# Patient Record
Sex: Female | Born: 1965 | Race: White | Hispanic: No | Marital: Married | Smoking: Never smoker
Health system: Southern US, Community
[De-identification: ages and names within clinical notes are randomized; demographics above are authoritative.]

## PROBLEM LIST (undated history)

## (undated) DIAGNOSIS — E063 Autoimmune thyroiditis: Secondary | ICD-10-CM

## (undated) DIAGNOSIS — E079 Disorder of thyroid, unspecified: Secondary | ICD-10-CM

## (undated) DIAGNOSIS — M8700 Idiopathic aseptic necrosis of unspecified bone: Secondary | ICD-10-CM

## (undated) HISTORY — DX: Disorder of thyroid, unspecified: E07.9

## (undated) HISTORY — PX: TOTAL HIP ARTHROPLASTY: SHX124

## (undated) HISTORY — DX: Autoimmune thyroiditis: E06.3

## (undated) HISTORY — PX: OTHER SURGICAL HISTORY: SHX169

## (undated) HISTORY — PX: TUBAL LIGATION: SHX77

## (undated) HISTORY — DX: Idiopathic aseptic necrosis of unspecified bone: M87.00

## (undated) HISTORY — PX: INCONTINENCE SURGERY: SHX676

## (undated) HISTORY — PX: SHOULDER SURGERY: SHX246

---

## 2014-08-07 ENCOUNTER — Ambulatory Visit: Payer: BLUE CROSS/BLUE SHIELD | Admitting: Family

## 2014-08-08 ENCOUNTER — Encounter: Payer: Self-pay | Admitting: Family

## 2014-08-08 ENCOUNTER — Other Ambulatory Visit: Payer: BLUE CROSS/BLUE SHIELD

## 2014-08-08 ENCOUNTER — Ambulatory Visit (INDEPENDENT_AMBULATORY_CARE_PROVIDER_SITE_OTHER): Payer: BLUE CROSS/BLUE SHIELD | Admitting: Family

## 2014-08-08 VITALS — BP 110/70 | HR 87 | Temp 98.3°F | Resp 18 | Ht 66.0 in | Wt 185.0 lb

## 2014-08-08 DIAGNOSIS — M87052 Idiopathic aseptic necrosis of left femur: Secondary | ICD-10-CM

## 2014-08-08 DIAGNOSIS — E559 Vitamin D deficiency, unspecified: Secondary | ICD-10-CM

## 2014-08-08 DIAGNOSIS — E039 Hypothyroidism, unspecified: Secondary | ICD-10-CM

## 2014-08-08 DIAGNOSIS — M8700 Idiopathic aseptic necrosis of unspecified bone: Secondary | ICD-10-CM | POA: Insufficient documentation

## 2014-08-08 NOTE — Assessment & Plan Note (Signed)
Previous TSH was 0.67. Hypothyroidism stable with current regimen. Continue Armour Thyroid 75 mg daily.

## 2014-08-08 NOTE — Assessment & Plan Note (Signed)
Patient with previous history of vitamin D deficiency. Obtain vitamin D levels to determine current status. Treatment pending blood work.

## 2014-08-08 NOTE — Assessment & Plan Note (Signed)
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 She recently had her right hip joint replaced. She is scheduled to undergo total left hip replacement in June of this year. This will be completed [facility] in [place]. Follow-up as needed.

## 2014-08-08 NOTE — Progress Notes (Signed)
Subjective:    Patient ID: Kristi Kerr, female    DOB: 02/07/1966, 49 y.o.   MRN: [id number]   Chief Complaint  Patient presents with  . Establish Care    Discuss Vitamin D, Avascular necrosis, and hypothyroid    HPI:  Kristi Kerr is a 49 y.o. female who presents today to establish care and discuss her Vitamin D level.   1) Low Vitamin D - Previously noted to have low vitamin D levels and was previously taking 3 1,000 mg tablets of vitamin D daily and is now taking bone supplements and a multivitamin.   2) Avascular necrosis - Previously found to have bilateral AVN. She has since had her right hip replaced. She is scheduled to [facility].   3) Hypothyroidism - stable and currently maintained on Armour thyroid medication. Her last TSH was noted to be 0.67.   Allergies  Allergen Reactions  . Codeine Nausea Only  . Latex   . Naproxen Nausea And Vomiting    No current outpatient prescriptions on file prior to visit.   No current facility-administered medications on file prior to visit.    Past Medical History  Diagnosis Date  . Thyroid disease   . AVN (avascular necrosis of bone)   . Hashimoto's disease     Past Surgical History  Procedure Laterality Date  . Total hip arthroplasty    . Cesarean section    . Tubal ligation    . Incontinence surgery    . Right knee surgery    . Shoulder surgery Right     Family History  Problem Relation Age of Onset  . Heart disease Father   . Heart attack Father [id number]  . Hypertension Father     History   Social History  . Marital Status: Married    Spouse Name: N/A  . Number of Children: 2  . Years of Education: 16   Occupational History  . 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 Smoking status: Never Smoker   . Smokeless tobacco: Never Used  . Alcohol Use: No  . Drug Use: No  . Sexual Activity: Yes    Birth Control/ Protection: Surgical   Other Topics Concern  .  Not on file   Social History Narrative   Denies any religious beliefs effecting health care.     Review of Systems  Constitutional: Negative for fever and chills.  Endocrine: Negative for cold intolerance and heat intolerance.  Musculoskeletal: Positive for arthralgias (Left hip pain).      Objective:    BP 110/70 mmHg  Pulse 87  Temp(Src) 98.3 F (36.8 C) (Oral)  Resp 18  Ht 5\' 6"  (1.676 m)  Wt 185 lb (83.915 kg)  BMI 29.87 kg/m2  SpO2 98% Nursing note and vital signs reviewed.  Physical Exam  Constitutional: She is oriented to person, place, and time. She appears well-developed and well-nourished. No distress.  Cardiovascular: Normal rate, regular rhythm, normal heart sounds and intact distal pulses.   Pulmonary/Chest: Effort normal and breath sounds normal.  Neurological: She is alert and oriented to person, place, and time.  Skin: Skin is warm and dry.  Incision from surgery noted on right hip; pink and dry without evidence or redness or swelling.  Psychiatric: She has a normal mood and affect. Her behavior is normal. Judgment and thought content normal.       Assessment & Plan:

## 2014-08-08 NOTE — Patient Instructions (Signed)
Thank you for choosing [facility].  Summary/Instructions:  Please stop by the lab on the basement level of the building for your blood work. Your results will be released to MyChart (or called to you) after review, usually within 72 hours after test completion. If any changes need to be made, you will be notified at that same time.  If your symptoms worsen or fail to improve, please contact our office for further instruction, or in case of emergency go directly to the emergency room at the closest medical facility.   Vitamin D Deficiency Vitamin D is an important vitamin that your body needs. Having too little of it in your body is called a deficiency. A very bad deficiency can make your bones soft and can cause a condition called rickets.  Vitamin D is important to your body for different reasons, such as:   It helps your body absorb 2 minerals called calcium and phosphorus.  It helps make your bones healthy.  It may prevent some diseases, such as diabetes and multiple sclerosis.  It helps your muscles and heart. You can get vitamin D in several ways. It is a natural part of some foods. The vitamin is also added to some dairy products and cereals. Some people take vitamin D supplements. Also, your body makes vitamin D when you are in the sun. It changes the sun's rays into a form of the vitamin that your body can use. CAUSES   Not eating enough foods that contain vitamin D.  Not getting enough sunlight.  Having certain digestive system diseases that make it hard to absorb vitamin D. These diseases include Crohn's disease, chronic pancreatitis, and cystic fibrosis.  Having a surgery in which part of the stomach or small intestine is removed.  Being obese. Fat cells pull vitamin D out of your blood. That means that obese people may not have enough vitamin D left in their blood and in other body tissues.  Having chronic kidney or liver disease. RISK FACTORS Risk factors are  things that make you more likely to develop a vitamin D deficiency. They include:  Being older.  Not being able to get outside very much.  Living in a nursing home.  Having had broken bones.  Having weak or thin bones (osteoporosis).  Having a disease or condition that changes how your body absorbs vitamin D.  Having dark skin.  Some medicines such as seizure medicines or steroids.  Being overweight or obese. SYMPTOMS Mild cases of vitamin D deficiency may not have any symptoms. If you have a very bad case, symptoms may include:  Bone pain.  Muscle pain.  Falling often.  Broken bones caused by a minor injury, due to osteoporosis. DIAGNOSIS A blood test is the best way to tell if you have a vitamin D deficiency. TREATMENT Vitamin D deficiency can be treated in different ways. Treatment for vitamin D deficiency depends on what is causing it. Options include:  Taking vitamin D supplements.  Taking a calcium supplement. Your caregiver will suggest what dose is best for you. HOME CARE INSTRUCTIONS  Take any supplements that your caregiver prescribes. Follow the directions carefully. Take only the suggested amount.  Have your blood tested 2 months after you start taking supplements.  Eat foods that contain vitamin D. Healthy choices include:  Fortified dairy products, cereals, or juices. Fortified means vitamin D has been added to the food. Check the label on the package to be sure.  Fatty fish like salmon or  trout.  Eggs.  Oysters.  Do not use a tanning bed.  Keep your weight at a healthy level. Lose weight if you need to.  Keep all follow-up appointments. Your caregiver will need to perform blood tests to make sure your vitamin D deficiency is going away. SEEK MEDICAL CARE IF:  You have any questions about your treatment.  You continue to have symptoms of vitamin D deficiency.  You have nausea or vomiting.  You are constipated.  You feel  confused.  You have severe abdominal or back pain. MAKE SURE YOU:  Understand these instructions.  Will watch your condition.  Will get help right away if you are not doing well or get worse. Document Released: 07/27/2011 Document Revised: 08/29/2012 Document Reviewed: 07/27/2011 [facility] Patient Information 2015 [place]. This information is not intended to replace advice given to you by your health care provider. Make sure you discuss any questions you have with your health care provider.

## 2014-08-08 NOTE — Progress Notes (Signed)
Pre visit review using our clinic review tool, if applicable. No additional management support is needed unless otherwise documented below in the visit note. 

## 2014-08-11 ENCOUNTER — Encounter: Payer: Self-pay | Admitting: Family

## 2014-08-11 LAB — VITAMIN D 1,25 DIHYDROXY
Vitamin D 1, 25 (OH)2 Total: 69 pg/mL (ref 18–72)
Vitamin D2 1, 25 (OH)2: 8 pg/mL
Vitamin D3 1, 25 (OH)2: 61 pg/mL

## 2014-10-10 ENCOUNTER — Ambulatory Visit (INDEPENDENT_AMBULATORY_CARE_PROVIDER_SITE_OTHER): Payer: BLUE CROSS/BLUE SHIELD | Admitting: Family

## 2014-10-10 ENCOUNTER — Encounter: Payer: Self-pay | Admitting: Family

## 2014-10-10 ENCOUNTER — Other Ambulatory Visit (INDEPENDENT_AMBULATORY_CARE_PROVIDER_SITE_OTHER): Payer: BLUE CROSS/BLUE SHIELD

## 2014-10-10 DIAGNOSIS — Z01818 Encounter for other preprocedural examination: Secondary | ICD-10-CM | POA: Diagnosis not present

## 2014-10-10 DIAGNOSIS — Z0000 Encounter for general adult medical examination without abnormal findings: Secondary | ICD-10-CM | POA: Diagnosis not present

## 2014-10-10 LAB — CBC
HCT: 39.9 % (ref 36.0–46.0)
HEMOGLOBIN: 13.7 g/dL (ref 12.0–15.0)
MCHC: 34.3 g/dL (ref 30.0–36.0)
MCV: 85.8 fl (ref 78.0–100.0)
Platelets: 279 10*3/uL (ref 150.0–400.0)
RBC: 4.66 Mil/uL (ref 3.87–5.11)
RDW: 13.5 % (ref 11.5–15.5)
WBC: 5.7 10*3/uL (ref 4.0–10.5)

## 2014-10-10 LAB — POCT URINALYSIS DIPSTICK
BILIRUBIN UA: NEGATIVE
Blood, UA: NEGATIVE
GLUCOSE UA: NEGATIVE
Ketones, UA: NEGATIVE
Leukocytes, UA: NEGATIVE
NITRITE UA: NEGATIVE
SPEC GRAV UA: 1.01
Urobilinogen, UA: NEGATIVE
pH, UA: 8

## 2014-10-10 LAB — LIPID PANEL
Cholesterol: 185 mg/dL (ref 0–200)
HDL: 68.5 mg/dL (ref >39.00)
LDL Cholesterol: 101 mg/dL — ABNORMAL HIGH (ref 0–99)
NonHDL: 116.5
TRIGLYCERIDES: 78 mg/dL (ref 0.0–149.0)
Total CHOL/HDL Ratio: 3
VLDL: 15.6 mg/dL (ref 0.0–40.0)

## 2014-10-10 LAB — COMPREHENSIVE METABOLIC PANEL
ALT: 11 U/L (ref 0–35)
AST: 17 U/L (ref 0–37)
Albumin: 4.5 g/dL (ref 3.5–5.2)
Alkaline Phosphatase: 96 U/L (ref 39–117)
BUN: 9 mg/dL (ref 6–23)
CO2: 31 meq/L (ref 19–32)
Calcium: 9.4 mg/dL (ref 8.4–10.5)
Chloride: 103 mEq/L (ref 96–112)
Creatinine, Ser: 0.75 mg/dL (ref 0.40–1.20)
GFR: 88.17 mL/min (ref >60.00)
Glucose, Bld: 103 mg/dL — ABNORMAL HIGH (ref 70–99)
Potassium: 4.1 mEq/L (ref 3.5–5.1)
SODIUM: 138 meq/L (ref 135–145)
TOTAL PROTEIN: 7.5 g/dL (ref 6.0–8.3)
Total Bilirubin: 0.4 mg/dL (ref 0.2–1.2)

## 2014-10-10 LAB — APTT: aPTT: 34.3 s — ABNORMAL HIGH (ref 23.4–32.7)

## 2014-10-10 LAB — TSH: TSH: 0.83 u[IU]/mL (ref 0.35–4.50)

## 2014-10-10 LAB — PROTIME-INR
INR: 1 ratio (ref 0.8–1.0)
Prothrombin Time: 11 s (ref 9.6–13.1)

## 2014-10-10 NOTE — Assessment & Plan Note (Addendum)
1) Anticipatory Guidance: Discussed importance of wearing a seatbelt while driving and not texting while driving; changing batteries in smoke detector at least once annually; wearing suntan lotion when outside; eating a balanced and moderate diet; getting physical activity at least 30 minutes per day.  2) Immunizations / Screenings / Labs:  All immunizations are up-to-date per recommendations. All screenings are up-to-date per recommendations. Obtain CBC, complete metabolic panel, Lipid profile and TSH. Obtain EKG.   Overall well exam. Patient has minimal cardiovascular risk factors at this time. Her BMI is 30 indicating obesity. Discussed importance of nutrient dense diet and increasing physical activity as tolerated by her hip. The goal would be to lose approximately 5-10% of her current body weight when able. She is scheduled to undergo hip replacement in 2 weeks. Follow-up prevention exam in one year. Follow-up office visit pending surgery and blood work.

## 2014-10-10 NOTE — Patient Instructions (Addendum)
Thank you for choosing [facility].  Summary/Instructions:  Please continue to take your medications as prescribed.   [facility] Address: [address] [place], [place] [address] [phone]  Please stop by the lab on the basement level of the building for your blood work. Your results will be released to [place] (or called to you) after review, usually within 72 hours after test completion. If any changes need to be made, you will be notified at that same time.   Health Maintenance Adopting a healthy lifestyle and getting preventive care can go a long way to promote health and wellness. Talk with your health care provider about what schedule of regular examinations is right for you. This is a good chance for you to check in with your provider about disease prevention and staying healthy. In between checkups, there are plenty of things you can do on your own. Experts have done a lot of research about which lifestyle changes and preventive measures are most likely to keep you healthy. Ask your health care provider for more information. WEIGHT AND DIET  Eat a healthy diet  Be sure to include plenty of vegetables, fruits, low-fat dairy products, and lean protein.  Do not eat a lot of foods high in solid fats, added sugars, or salt.  Get regular exercise. This is one of the most important things you can do for your health.  Most adults should exercise for at least 150 minutes each week. The exercise should increase your heart rate and make you sweat (moderate-intensity exercise).  Most adults should also do strengthening exercises at least twice a week. This is in addition to the moderate-intensity exercise.  Maintain a healthy weight  Body mass index (BMI) is a measurement that can be used to identify possible weight problems. It estimates body fat based on height and weight. Your health care provider can help determine your BMI and help you  achieve or maintain a healthy weight.  For females 31 years of age and older:   A BMI below 18.5 is considered underweight.  A BMI of 18.5 to 24.9 is normal.  A BMI of 25 to 29.9 is considered overweight.  A BMI of 30 and above is considered obese.  Watch levels of cholesterol and blood lipids  You should start having your blood tested for lipids and cholesterol at 47 years of age, then have this test every 5 years.  You may need to have your cholesterol levels checked more often if:  Your lipid or cholesterol levels are high.  You are older than 47 years of age.  You are at high risk for heart disease.  CANCER SCREENING   Lung Cancer  Lung cancer screening is recommended for adults 25-50 years old who are at high risk for lung cancer because of a history of smoking.  A yearly low-dose CT scan of the lungs is recommended for people who:  Currently smoke.  Have quit within the past 15 years.  Have at least a 30-pack-year history of smoking. A pack year is smoking an average of one pack of cigarettes a day for 1 year.  Yearly screening should continue until it has been 15 years since you quit.  Yearly screening should stop if you develop a health problem that would prevent you from having lung cancer treatment.  Breast Cancer  Practice breast self-awareness. This means understanding how your breasts normally appear and feel.  It also means doing regular breast self-exams.  health care provider know about any changes, no matter how small.  If you are in your 20s or 30s, you should have a clinical breast exam (CBE) by a health care provider every 1-3 years as part of a regular health exam.  If you are 40 or older, have a CBE every year. Also consider having a breast X-ray (mammogram) every year.  If you have a family history of breast cancer, talk to your health care provider about genetic screening.  If you are at high risk for breast cancer, talk to your health  care provider about having an MRI and a mammogram every year.  Breast cancer gene (BRCA) assessment is recommended for women who have family members with BRCA-related cancers. BRCA-related cancers include:  Breast.  Ovarian.  Tubal.  Peritoneal cancers.  Results of the assessment will determine the need for genetic counseling and BRCA1 and BRCA2 testing. Cervical Cancer Routine pelvic examinations to screen for cervical cancer are no longer recommended for nonpregnant women who are considered low risk for cancer of the pelvic organs (ovaries, uterus, and vagina) and who do not have symptoms. A pelvic examination may be necessary if you have symptoms including those associated with pelvic infections. Ask your health care provider if a screening pelvic exam is right for you.   The Pap test is the screening test for cervical cancer for women who are considered at risk.  If you had a hysterectomy for a problem that was not cancer or a condition that could lead to cancer, then you no longer need Pap tests.  If you are older than 65 years, and you have had normal Pap tests for the past 10 years, you no longer need to have Pap tests.  If you have had past treatment for cervical cancer or a condition that could lead to cancer, you need Pap tests and screening for cancer for at least 20 years after your treatment.  If you no longer get a Pap test, assess your risk factors if they change (such as having a new sexual partner). This can affect whether you should start being screened again.  Some women have medical problems that increase their chance of getting cervical cancer. If this is the case for you, your health care provider may recommend more frequent screening and Pap tests.  The human papillomavirus (HPV) test is another test that may be used for cervical cancer screening. The HPV test looks for the virus that can cause cell changes in the cervix. The cells collected during the Pap test can  be tested for HPV.  The HPV test can be used to screen women 30 years of age and older. Getting tested for HPV can extend the interval between normal Pap tests from three to five years.  An HPV test also should be used to screen women of any age who have unclear Pap test results.  After 47 years of age, women should have HPV testing as often as Pap tests.  Colorectal Cancer  This type of cancer can be detected and often prevented.  Routine colorectal cancer screening usually begins at 47 years of age and continues through 47 years of age.  Your health care provider may recommend screening at an earlier age if you have risk factors for colon cancer.  Your health care provider may also recommend using home test kits to check for hidden blood in the stool.  A small camera at the end of a tube can be used to examine   your colon directly (sigmoidoscopy or colonoscopy). This is done to check for the earliest forms of colorectal cancer.  Routine screening usually begins at age 50.  Direct examination of the colon should be repeated every 5-10 years through 47 years of age. However, you may need to be screened more often if early forms of precancerous polyps or small growths are found. Skin Cancer  Check your skin from head to toe regularly.  Tell your health care provider about any new moles or changes in moles, especially if there is a change in a mole's shape or color.  Also tell your health care provider if you have a mole that is larger than the size of a pencil eraser.  Always use sunscreen. Apply sunscreen liberally and repeatedly throughout the day.  Protect yourself by wearing long sleeves, pants, a wide-brimmed hat, and sunglasses whenever you are outside. HEART DISEASE, DIABETES, AND HIGH BLOOD PRESSURE   Have your blood pressure checked at least every 1-2 years. High blood pressure causes heart disease and increases the risk of stroke.  If you are between 55 years and 79  years old, ask your health care provider if you should take aspirin to prevent strokes.  Have regular diabetes screenings. This involves taking a blood sample to check your fasting blood sugar level.  If you are at a normal weight and have a low risk for diabetes, have this test once every three years after 47 years of age.  If you are overweight and have a high risk for diabetes, consider being tested at a younger age or more often. PREVENTING INFECTION  Hepatitis B  If you have a higher risk for hepatitis B, you should be screened for this virus. You are considered at high risk for hepatitis B if:  You were born in a country where hepatitis B is common. Ask your health care provider which countries are considered high risk.  Your parents were born in a high-risk country, and you have not been immunized against hepatitis B (hepatitis B vaccine).  You have HIV or AIDS.  You use needles to inject street drugs.  You live with someone who has hepatitis B.  You have had sex with someone who has hepatitis B.  You get hemodialysis treatment.  You take certain medicines for conditions, including cancer, organ transplantation, and autoimmune conditions. Hepatitis C  Blood testing is recommended for:  Everyone born from 1945 through 1965.  Anyone with known risk factors for hepatitis C. Sexually transmitted infections (STIs)  You should be screened for sexually transmitted infections (STIs) including gonorrhea and chlamydia if:  You are sexually active and are younger than 47 years of age.  You are older than 47 years of age and your health care provider tells you that you are at risk for this type of infection.  Your sexual activity has changed since you were last screened and you are at an increased risk for chlamydia or gonorrhea. Ask your health care provider if you are at risk.  If you do not have HIV, but are at risk, it may be recommended that you take a prescription  medicine daily to prevent HIV infection. This is called pre-exposure prophylaxis (PrEP). You are considered at risk if:  You are sexually active and do not regularly use condoms or know the HIV status of your partner(s).  You take drugs by injection.  You are sexually active with a partner who has HIV. Talk with your health care provider about whether   you are at high risk of being infected with HIV. If you choose to begin PrEP, you should first be tested for HIV. You should then be tested every 3 months for as long as you are taking PrEP.  PREGNANCY   If you are premenopausal and you may become pregnant, ask your health care provider about preconception counseling.  If you may become pregnant, take 400 to 800 micrograms (mcg) of folic acid every day.  If you want to prevent pregnancy, talk to your health care provider about birth control (contraception). OSTEOPOROSIS AND MENOPAUSE   Osteoporosis is a disease in which the bones lose minerals and strength with aging. This can result in serious bone fractures. Your risk for osteoporosis can be identified using a bone density scan.  If you are 65 years of age or older, or if you are at risk for osteoporosis and fractures, ask your health care provider if you should be screened.  Ask your health care provider whether you should take a calcium or vitamin D supplement to lower your risk for osteoporosis.  Menopause may have certain physical symptoms and risks.  Hormone replacement therapy may reduce some of these symptoms and risks. Talk to your health care provider about whether hormone replacement therapy is right for you.  HOME CARE INSTRUCTIONS   Schedule regular health, dental, and eye exams.  Stay current with your immunizations.   Do not use any tobacco products including cigarettes, chewing tobacco, or electronic cigarettes.  If you are pregnant, do not drink alcohol.  If you are breastfeeding, limit how much and how often you  drink alcohol.  Limit alcohol intake to no more than 1 drink per day for nonpregnant women. One drink equals 12 ounces of beer, 5 ounces of wine, or 1 ounces of hard liquor.  Do not use street drugs.  Do not share needles.  Ask your health care provider for help if you need support or information about quitting drugs.  Tell your health care provider if you often feel depressed.  Tell your health care provider if you have ever been abused or do not feel safe at home. Document Released: 11/17/2010 Document Revised: 09/18/2013 Document Reviewed: 04/05/2013 [employer] Patient Information 2015 [employer]. This information is not intended to replace advice given to you by your health care provider. Make sure you discuss any questions you have with your health care provider.    

## 2014-10-10 NOTE — Progress Notes (Signed)
Subjective:    Patient ID: Sallye LatMary Tavano, female    DOB: 08/24/1965, 49 y.o.   MRN: [id number]  Chief Complaint  Patient presents with  . Pre operative check up    Fasting     HPI:  Sallye LatMary Tenny is a 49 y.o. female who presents today for an annual wellness visit.   1) Health Maintenance -   Diet - Averages 2-3 meals per day consisting of fruits, vegetables, and meats. Caffeine 2-3 cups per day.  Exercise - As tolerated; limited by her current hip pain  2) Preventative Exams / Immunizations:  Dental -- Up to date  Vision -- Up to date   Health Maintenance  Topic Date Due  . HIV Screening  02/20/1983  . INFLUENZA VACCINE  12/17/2014  . PAP SMEAR  04/18/2016  . TETANUS/TDAP  09/04/2019  Will call and schedule PAP;    There is no immunization history on file for this patient.  Allergies  Allergen Reactions  . Codeine Nausea Only  . Latex   . Naproxen Nausea And Vomiting     Outpatient Prescriptions Prior to Visit  Medication Sig Dispense Refill  . thyroid (ARMOUR) 15 MG tablet Take 15 mg by mouth daily.    Marland Kitchen. thyroid (ARMOUR) 60 MG tablet Take 60 mg by mouth daily before breakfast.     No facility-administered medications prior to visit.     Past Medical History  Diagnosis Date  . Thyroid disease   . AVN (avascular necrosis of bone)   . Hashimoto's disease      Past Surgical History  Procedure Laterality Date  . Total hip arthroplasty    . Cesarean section    . Tubal ligation    . Incontinence surgery    . Right knee surgery    . Shoulder surgery Right      Family History  Problem Relation Age of Onset  . Heart disease Father   . Heart attack Father [id number]  . Hypertension Father      History   Social History  . Marital Status: Married    Spouse Name: N/A  . Number of Children: 2  . Years of Education: 16   Occupational History  . 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 Smoking status: Never Smoker   . Smokeless tobacco: Never  Used  . Alcohol Use: No  . Drug Use: No  . Sexual Activity: Yes    Birth Control/ Protection: Surgical   Other Topics Concern  . Not on file   Social History Narrative   Denies any religious beliefs effecting health care.       Review of Systems  Constitutional: Denies fever, chills, fatigue, or significant weight gain/loss. HENT: Head: Denies headache or neck pain Ears: Denies changes in hearing, ringing in ears, earache, drainage Nose: Denies discharge, stuffiness, itching, nosebleed, sinus pain Throat: Denies sore throat, hoarseness, dry mouth, sores, thrush Eyes: Denies loss/changes in vision, pain, redness, blurry/double vision, flashing lights Cardiovascular: Denies chest pain/discomfort, tightness, palpitations, shortness of breath with activity, difficulty lying down, swelling, sudden awakening with shortness of breath Respiratory: Denies shortness of breath, cough, sputum production, wheezing Gastrointestinal: Denies dysphasia, heartburn, change in appetite, nausea, change in bowel habits, rectal bleeding, constipation, diarrhea, yellow skin or eyes Genitourinary: Denies frequency, urgency, burning/pain, blood in urine, incontinence, change in urinary strength. Musculoskeletal: Denies muscle/joint pain (other than described below), stiffness, back pain, redness or swelling of joints, trauma Positive for hip pain  Skin: Denies rashes, lumps, itching, dryness, color changes, or hair/nail changes Neurological: Denies dizziness, fainting, seizures, weakness, numbness, tingling, tremor Psychiatric - Denies nervousness, stress, depression or memory loss Endocrine: Denies heat or cold intolerance, sweating, frequent urination, excessive thirst, changes in appetite Hematologic: Denies ease of bruising or bleeding     Objective:    BP 108/84 mmHg  Pulse 74  Temp(Src) 98.2 F (36.8 C) (Oral)  Resp 18  Ht  (1.676 m)  Wt 187 lb (84.823 kg)  BMI 30.20 kg/m2  SpO2  95% Nursing note and vital signs reviewed.  Physical Exam  Constitutional: She is oriented to person, place, and time. She appears well-developed and well-nourished.  HENT:  Head: Normocephalic.  Right Ear: Hearing, tympanic membrane, external ear and ear canal normal.  Left Ear: Hearing, tympanic membrane, external ear and ear canal normal.  Nose: Nose normal.  Mouth/Throat: Uvula is midline, oropharynx is clear and moist and mucous membranes are normal.  Eyes: Conjunctivae and EOM are normal. Pupils are equal, round, and reactive to light.  Neck: Neck supple. No JVD present. No tracheal deviation present. No thyromegaly present.  Cardiovascular: Normal rate, regular rhythm, normal heart sounds and intact distal pulses.   Pulmonary/Chest: Effort normal and breath sounds normal.  Abdominal: Soft. Bowel sounds are normal. She exhibits no distension and no mass. There is no tenderness. There is no rebound and no guarding.  Musculoskeletal: Normal range of motion. She exhibits no edema or tenderness.  Lymphadenopathy:    She has no cervical adenopathy.  Neurological: She is alert and oriented to person, place, and time. She has normal reflexes. No cranial nerve deficit. She exhibits normal muscle tone. Coordination normal.  Skin: Skin is warm and dry.  Psychiatric: She has a normal mood and affect. Her behavior is normal. Judgment and thought content normal.       Assessment & Plan:    Problem List Items Addressed This Visit      Other   Routine general medical examination at a health care facility    1) Anticipatory Guidance: Discussed importance of wearing a seatbelt while driving and not texting while driving; changing batteries in smoke detector at least once annually; wearing suntan lotion when outside; eating a balanced and moderate diet; getting physical activity at least 30 minutes per day.  2) Immunizations / Screenings / Labs:  All immunizations are up-to-date per  recommendations. All screenings are up-to-date per recommendations. Obtain CBC, complete metabolic panel, Lipid profile and TSH. Obtain EKG.   Overall well exam. Patient has minimal cardiovascular risk factors at this time. Her BMI is 30 indicating obesity. Discussed importance of nutrient dense diet and increasing physical activity as tolerated by her hip. The goal would be to lose approximately 5-10% of her current body weight when able. She is scheduled to undergo hip replacement in 2 weeks. Follow-up prevention exam in one year. Follow-up office visit pending surgery and blood work.       Relevant Orders   CBC (Completed)   Lipid panel (Completed)   TSH (Completed)   Comprehensive metabolic panel (Completed)   Preoperative clearance    History and physical examination performed for surgical clearance. Obtain ECG. Obtain INR/PT, PTT, urinalysis and urine culture. ECG shows normal sinus rhythm. Blood work reveals slightly elevated PTT, however this should not affect surgery. Also noted to have increased fasting blood glucose, again which should not affect surgery. Based on current exam and lab data patient is medically cleared for her  surgery with no restrictions.       Relevant Orders   EKG 12-Lead (Completed)   POCT urinalysis dipstick (Completed)   Urine culture   PTT (Completed)   INR/PT (Completed)

## 2014-10-10 NOTE — Assessment & Plan Note (Addendum)
History and physical examination performed for surgical clearance. Obtain ECG. Obtain INR/PT, PTT, urinalysis and urine culture. ECG shows normal sinus rhythm. Blood work reveals slightly elevated PTT, however this should not affect surgery. Also noted to have increased fasting blood glucose, again which should not affect surgery. Based on current exam and lab data patient is medically cleared for her surgery with no restrictions.

## 2014-10-10 NOTE — Progress Notes (Signed)
Pre visit review using our clinic review tool, if applicable. No additional management support is needed unless otherwise documented below in the visit note. 

## 2014-10-12 LAB — URINE CULTURE

## 2014-10-18 ENCOUNTER — Telehealth: Payer: Self-pay | Admitting: Family

## 2014-10-18 NOTE — Telephone Encounter (Signed)
Paperwork faxed °

## 2014-10-18 NOTE — Telephone Encounter (Signed)
Requesting surgery clearance notes to be faxed to [phone].  Needs notes before Monday the 6th

## 2014-10-25 ENCOUNTER — Telehealth: Payer: Self-pay | Admitting: Family

## 2014-10-25 NOTE — Telephone Encounter (Signed)
Kristi Kerr with [facility] called back.  Her phone number [phone].  States she got the fax but needs basic metabolic panel faxed.

## 2014-10-25 NOTE — Telephone Encounter (Signed)
Error

## 2014-10-25 NOTE — Telephone Encounter (Signed)
CMET was performed and should cover everything that is needed in a BMET.

## 2014-10-26 NOTE — Telephone Encounter (Signed)
Returned call they stated they had everything they needed.

## 2014-11-08 ENCOUNTER — Telehealth: Payer: Self-pay | Admitting: Family

## 2014-11-08 NOTE — Telephone Encounter (Signed)
Received records from [facility] forwarded to Dr. Carver Fila 11/08/14 fbg.

## 2014-11-09 ENCOUNTER — Ambulatory Visit (INDEPENDENT_AMBULATORY_CARE_PROVIDER_SITE_OTHER): Payer: BLUE CROSS/BLUE SHIELD | Admitting: Internal Medicine

## 2014-11-09 ENCOUNTER — Encounter: Payer: Self-pay | Admitting: Internal Medicine

## 2014-11-09 VITALS — BP 132/84 | HR 93 | Temp 98.8°F | Ht 66.0 in | Wt 194.0 lb

## 2014-11-09 DIAGNOSIS — H029 Unspecified disorder of eyelid: Secondary | ICD-10-CM | POA: Insufficient documentation

## 2014-11-09 MED ORDER — DOXYCYCLINE HYCLATE 100 MG PO TABS: 100.0000 mg | ORAL_TABLET | Freq: Two times a day (BID) | ORAL | Status: DC

## 2014-11-09 NOTE — Patient Instructions (Signed)
Please take all new medication as prescribed - the antibiotic  You can also try OTC Cortizone 10 for anti-inflammatory to the site  Please continue all other medications as before, and refills have been done if requested.  Please have the pharmacy call with any other refills you may need.  Please keep your appointments with your specialists as you may have planned

## 2014-11-09 NOTE — Assessment & Plan Note (Signed)
Etiology unclear, ? Chalazion but cannot see stye source, and has hx of husbadn recent infection and recent hospital exposure, for doxy course, consider otc cortisone prn,  to f/u any worsening symptoms or concerns

## 2014-11-09 NOTE — Progress Notes (Signed)
   Subjective:    Patient ID: Sallye Lat, female    DOB: 01-25-1966, 49 y.o.   MRN: [id number]  HPI   Here to f/u, just s/p left hip THR at [facility] 8 days ago, now walking without pain or with cane, bruising some improved as well.  Feels overall quite well today except for mild erythema and swelling x 1 days to right upper eyelid medial aspect, maybe a small itchy, does not really feel painful..  Very concerned as she required tx with mupirocin post op, not sure about whether MRSA PCR done preop, but husband incidentally with similar erythema about the right eye and suborbital better with her mupirocin it seems.  No makup use or obvoius contact dermatitis potential Past Medical History  Diagnosis Date  . Thyroid disease   . AVN (avascular necrosis of bone)   . Hashimoto's disease    Past Surgical History  Procedure Laterality Date  . Total hip arthroplasty    . Cesarean section    . Tubal ligation    . Incontinence surgery    . Right knee surgery    . Shoulder surgery Right     reports that she has never smoked. She has never used smokeless tobacco. She reports that she does not drink alcohol or use illicit drugs. family history includes Heart attack (age of onset: 53) in her father; Heart disease in her father; Hypertension in her father. Allergies  Allergen Reactions  . Codeine Nausea Only  . Latex   . Naproxen Nausea And Vomiting   Current Outpatient Prescriptions on File Prior to Visit  Medication Sig Dispense Refill  . thyroid (ARMOUR) 15 MG tablet Take 15 mg by mouth daily.    Marland Kitchen thyroid (ARMOUR) 60 MG tablet Take 60 mg by mouth daily before breakfast.     No current facility-administered medications on file prior to visit.   Review of Systems All otherwise neg per pt     Objective:   Physical Exam BP 132/84 mmHg  Pulse 93  Temp(Src) 98.8 F (37.1 C) (Oral)  Ht 5\' 6"  (1.676 m)  Wt 194 lb (87.998 kg)  BMI 31.33 kg/m2 VS noted,  Constitutional: Pt appears in  no significant distress HENT: Head: NCAT.  Right Ear: External ear normal.  Right upper eyelid with no obvious stye, but has approx 8 mm area small area erythema/swelling nontender, no drainage Left Ear: External ear normal.  Eyes: . Pupils are equal, round, and reactive to light. Conjunctivae and EOM are normal Neck: Normal range of motion. Neck supple.  Cardiovascular: Normal rate and regular rhythm.   Pulmonary/Chest: Effort normal and breath sounds without rales or wheezing.  Neurological: Pt is alert. Not confused , motor grossly intact Skin: Skin is warm. No rash, no LE edema Psychiatric: Pt behavior is normal. No agitation.      Assessment & Plan:

## 2014-11-09 NOTE — Progress Notes (Signed)
Pre visit review using our clinic review tool, if applicable. No additional management support is needed unless otherwise documented below in the visit note. 

## 2014-11-20 ENCOUNTER — Encounter: Payer: Self-pay | Admitting: Family

## 2014-11-20 ENCOUNTER — Ambulatory Visit (INDEPENDENT_AMBULATORY_CARE_PROVIDER_SITE_OTHER): Payer: BLUE CROSS/BLUE SHIELD | Admitting: Family

## 2014-11-20 VITALS — BP 110/64 | HR 111 | Temp 98.0°F | Resp 18 | Ht 66.0 in | Wt 188.8 lb

## 2014-11-20 DIAGNOSIS — E039 Hypothyroidism, unspecified: Secondary | ICD-10-CM

## 2014-11-20 DIAGNOSIS — Z7184 Encounter for health counseling related to travel: Secondary | ICD-10-CM | POA: Insufficient documentation

## 2014-11-20 DIAGNOSIS — Z23 Encounter for immunization: Secondary | ICD-10-CM | POA: Diagnosis not present

## 2014-11-20 DIAGNOSIS — M87052 Idiopathic aseptic necrosis of left femur: Secondary | ICD-10-CM | POA: Diagnosis not present

## 2014-11-20 DIAGNOSIS — Z7189 Other specified counseling: Secondary | ICD-10-CM | POA: Diagnosis not present

## 2014-11-20 MED ORDER — CIPROFLOXACIN HCL 500 MG PO TABS: 500.0000 mg | ORAL_TABLET | Freq: Two times a day (BID) | ORAL | Status: DC

## 2014-11-20 MED ORDER — THYROID 90 MG PO TABS: 90.0000 mg | ORAL_TABLET | Freq: Every day | ORAL | Status: DC

## 2014-11-20 NOTE — Progress Notes (Signed)
Pre visit review using our clinic review tool, if applicable. No additional management support is needed unless otherwise documented below in the visit note. 

## 2014-11-20 NOTE — Assessment & Plan Note (Signed)
Provided information regarding the [facility]. In office tetanus updated. Cipro provided for Traveler's diarrhea. Follow up as needed prior to travel for vaccinations.

## 2014-11-20 NOTE — Assessment & Plan Note (Signed)
Previous TSH is 0.84 indicating adequate control, however continues to experience symptoms of hypothyroidism. Increase Armour thyroid to 90 mg daily. Follow up in 2 months for repeat TSH.

## 2014-11-20 NOTE — Assessment & Plan Note (Signed)
Follow up post total hip replacement without complication. Incision site is clean, dry and intact with no evidence of infection or dehiscence. Per patient request, okay to swim. Continue physical therapy and hip precautions as instructed. Follow up if symptoms worsen or do not continue to improve.

## 2014-11-20 NOTE — Progress Notes (Signed)
Subjective:    Patient ID: Kristi Kerr, female    DOB: 02-12-1966, 49 y.o.   MRN: [id number]  Chief Complaint  Patient presents with  . Follow-up    wants to see if its ok for her to get in the pool, needs vaccines, will be going to [place] job related, malaria, wants to see about going back up to 90 mg on thyroid medication    HPI:  Kristi Kerr is a 49 y.o. female with a PMH of hypothyroidism, avascular necrosis, and vitamin D deficiency who presents today for a post surgical office follow-up.  1.) Post-surgical follow up - patient had total hip replacement approximately 2-1/2 weeks ago through her outside medical facility. Indicates that her surgery went well with no complications. She remains with minimal hip precautions with the only exception of crossing her legs at the moment. Reports her scar is healing well with no evidence of infection and her mobility continues to improve. She is working with physical therapy to finalize her rehabilitation. She no longer takes her pain medication as it is no longer needed. Denies fevers, chills, or other signs of infection at the wound site.   2.) Hypothyroidism - reports he associated symptoms of cold intolerance, and changes to her skin, hair, and nails. He notes that when she was on a higher dose of the Armour thyroid she had less symptoms. Currently takes 75 mg daily as prescribed. Questions increase of medication to assist with her symptoms.   3.) Vaccinations -  is scheduled to go for a business trip to [place] in the coming weeks and is in need of vaccinations for this trip. Requesting tetanus, yellow fever, and malaria vaccinations.   Allergies  Allergen Reactions  . Codeine Nausea Only  . Latex   . Naproxen Nausea And Vomiting    No current outpatient prescriptions on file prior to visit.   No current facility-administered medications on file prior to visit.   Past Medical History  Diagnosis Date  . Thyroid disease   . AVN  (avascular necrosis of bone)   . Hashimoto's disease     Review of Systems  Constitutional: Negative for fever, chills and fatigue.      Objective:    BP 110/64 mmHg  Pulse 111  Temp(Src) 98 F (36.7 C) (Oral)  Resp 18  Ht  (1.676 m)  Wt 188 lb 12.8 oz (85.639 kg)  BMI 30.49 kg/m2  SpO2 98% Nursing note and vital signs reviewed.  Physical Exam  Constitutional: She is oriented to person, place, and time. She appears well-developed and well-nourished. No distress.  Cardiovascular: Normal rate, regular rhythm, normal heart sounds and intact distal pulses.   Pulmonary/Chest: Effort normal and breath sounds normal.  Neurological: She is alert and oriented to person, place, and time.  Skin: Skin is warm and dry.     Psychiatric: She has a normal mood and affect. Her behavior is normal. Judgment and thought content normal.       Assessment & Plan:   Problem List Items Addressed This Visit      Endocrine   Hypothyroidism    Previous TSH is 0.84 indicating adequate control, however continues to experience symptoms of hypothyroidism. Increase Armour thyroid to 90 mg daily. Follow up in 2 months for repeat TSH.       Relevant Medications   thyroid (ARMOUR) 90 MG tablet     Musculoskeletal and Integument   Avascular necrosis of bone of left hip  Follow up post total hip replacement without complication. Incision site is clean, dry and intact with no evidence of infection or dehiscence. Per patient request, okay to swim. Continue physical therapy and hip precautions as instructed. Follow up if symptoms worsen or do not continue to improve.         Other   Counseling for travel    Provided information regarding the [facility]. In office tetanus updated. Cipro provided for Traveler's diarrhea. Follow up as needed prior to travel for vaccinations.       Relevant Medications   ciprofloxacin (CIPRO) 500 MG tablet   Other Relevant Orders   Td vaccine  greater than or equal to 7yo preservative free IM (Completed)    Other Visit Diagnoses    Need for TD vaccine    -  Primary    Relevant Orders    Td vaccine greater than or equal to 7yo preservative free IM (Completed)

## 2014-11-20 NOTE — Patient Instructions (Addendum)
[facility] -  For your convenience, we offer services in both our [place] and [place]. [place] we are located at [address].  Our [place] clinic is located at [address].  Call [phone], Monday-Friday for individual appointments.  Thank you for choosing [facility].  Summary/Instructions:  Your prescription(s) have been submitted to your pharmacy or been printed and provided for you. Please take as directed and contact our office if you believe you are having problem(s) with the medication(s) or have any questions.  If your symptoms worsen or fail to improve, please contact our office for further instruction, or in case of emergency go directly to the emergency room at the closest medical facility.

## 2014-11-21 ENCOUNTER — Telehealth: Payer: Self-pay | Admitting: Family

## 2014-11-21 ENCOUNTER — Other Ambulatory Visit: Payer: Self-pay | Admitting: Family

## 2014-11-21 MED ORDER — ATOVAQUONE-PROGUANIL HCL 250-100 MG PO TABS: 1.0000 | ORAL_TABLET | Freq: Every day | ORAL | Status: DC

## 2014-11-21 NOTE — Telephone Encounter (Signed)
Pt came in the office. Script was printed and given straight to pt.

## 2014-11-21 NOTE — Telephone Encounter (Signed)
Pt called in and said that she talked to [facility] and they do carry a couple different med for Malaria.  She said to go ahead and call one in and they would call you if they needed to change it.   [facility] on [address]

## 2014-11-21 NOTE — Telephone Encounter (Signed)
Please call [facility] on [address] to determine what needs to be written for prescription and I will send it in. Thanks.

## 2015-01-09 ENCOUNTER — Encounter: Payer: Self-pay | Admitting: Family

## 2015-01-09 ENCOUNTER — Other Ambulatory Visit (INDEPENDENT_AMBULATORY_CARE_PROVIDER_SITE_OTHER): Payer: BLUE CROSS/BLUE SHIELD

## 2015-01-09 ENCOUNTER — Ambulatory Visit (INDEPENDENT_AMBULATORY_CARE_PROVIDER_SITE_OTHER): Payer: BLUE CROSS/BLUE SHIELD | Admitting: Family

## 2015-01-09 VITALS — BP 112/82 | HR 80 | Temp 98.4°F | Resp 18 | Ht 66.0 in | Wt 187.0 lb

## 2015-01-09 DIAGNOSIS — E039 Hypothyroidism, unspecified: Secondary | ICD-10-CM

## 2015-01-09 DIAGNOSIS — J069 Acute upper respiratory infection, unspecified: Secondary | ICD-10-CM | POA: Insufficient documentation

## 2015-01-09 LAB — TSH: TSH: 0.5 u[IU]/mL (ref 0.35–4.50)

## 2015-01-09 NOTE — Assessment & Plan Note (Signed)
Stable with current dosage of Armour and reports improvements in skin, hair and nails. Obtain TSH. Continue current dosage of Armour pending lab results.

## 2015-01-09 NOTE — Progress Notes (Signed)
Subjective:    Patient ID: Kristi Kerr, female    DOB: 1965-06-08, 50 y.o.   MRN: [id number]  Chief Complaint  Patient presents with  . Sore Throat    sore throat, woke up this morning with drainage down the back of her throat, feels like its pressure in her ears, TSH check    HPI:  Kristi Kerr is a 49 y.o. female with a PMH of hypothyroidism, avascular necrosis of the hip, and vitamin D deficiency who presents today for an acute office visit.    1.) Sore throat - This is a new problem. Associated symptoms of sore throat, drainage, and pressure in her ears has been going on for several days. Reports a mild improvement today. Modifying factors choloraseptic    2.) Thyroid check - Currently maintained on Armour thyroid. Takes the medication as prescribed and denies adverse side effects.   Lab Results  Component Value Date   TSH 0.50 01/09/2015    Allergies  Allergen Reactions  . Codeine Nausea Only  . Latex   . Naproxen Nausea And Vomiting    Current Outpatient Prescriptions on File Prior to Visit  Medication Sig Dispense Refill  . atovaquone-proguanil (MALARONE) 250-100 MG TABS Take 1 tablet by mouth daily. Start 1-2 days before entering the country and continue for 1-2 days after 30 tablet 0  . ciprofloxacin (CIPRO) 500 MG tablet Take 1 tablet (500 mg total) by mouth 2 (two) times daily. 6 tablet 0  . thyroid (ARMOUR) 90 MG tablet Take 1 tablet (90 mg total) by mouth daily before breakfast. 60 tablet 0   No current facility-administered medications on file prior to visit.    Review of Systems  Constitutional: Negative for fever and chills.  HENT: Positive for congestion, ear pain and sore throat.   Respiratory: Negative for cough, chest tightness and shortness of breath.   Cardiovascular: Negative for chest pain.  Neurological: Negative for headaches.      Objective:    BP 112/82 mmHg  Pulse 80  Temp(Src) 98.4 F (36.9 C) (Oral)  Resp 18  Ht  (1.676 m)  Wt  187 lb (84.823 kg)  BMI 30.20 kg/m2  SpO2 97% Nursing note and vital signs reviewed.  Physical Exam  Constitutional: She is oriented to person, place, and time. She appears well-developed and well-nourished. No distress.  HENT:  Right Ear: Hearing, tympanic membrane, external ear and ear canal normal.  Left Ear: Hearing, tympanic membrane, external ear and ear canal normal.  Nose: Right sinus exhibits no maxillary sinus tenderness and no frontal sinus tenderness. Left sinus exhibits no maxillary sinus tenderness and no frontal sinus tenderness.  Mouth/Throat: Uvula is midline, oropharynx is clear and moist and mucous membranes are normal.  Cardiovascular: Normal rate, regular rhythm, normal heart sounds and intact distal pulses.   Pulmonary/Chest: Effort normal and breath sounds normal.  Neurological: She is alert and oriented to person, place, and time.  Skin: Skin is warm and dry.  Psychiatric: She has a normal mood and affect. Her behavior is normal. Judgment and thought content normal.       Assessment & Plan:   Problem List Items Addressed This Visit      Respiratory   Acute upper respiratory infection    Symptoms and exam consistent with upper respiratory infection most likely viral. Continue conservative treatment with over-the-counter medications and supplements as needed for supportive care and symptom relief. Follow-up if symptoms worsen or fail to improve.  Endocrine   Hypothyroidism - Primary    Stable with current dosage of Armour and reports improvements in skin, hair and nails. Obtain TSH. Continue current dosage of Armour pending lab results.       Relevant Orders   TSH (Completed)

## 2015-01-09 NOTE — Patient Instructions (Addendum)
Thank you for choosing [facility].  Summary/Instructions:  Your prescription(s) have been submitted to your pharmacy or been printed and provided for you. Please take as directed and contact our office if you believe you are having problem(s) with the medication(s) or have any questions.  Please stop by the lab on the basement level of the building for your blood work. Your results will be released to MyChart (or called to you) after review, usually within 72 hours after test completion. If any changes need to be made, you will be notified at that same time.  If your symptoms worsen or fail to improve, please contact our office for further instruction, or in case of emergency go directly to the emergency room at the closest medical facility.   Upper Respiratory Infection, Adult An upper respiratory infection (URI) is also sometimes known as the common cold. The upper respiratory tract includes the nose, sinuses, throat, trachea, and bronchi. Bronchi are the airways leading to the lungs. Most people improve within 1 week, but symptoms can last up to 2 weeks. A residual cough may last even longer.  CAUSES Many different viruses can infect the tissues lining the upper respiratory tract. The tissues become irritated and inflamed and often become very moist. Mucus production is also common. A cold is contagious. You can easily spread the virus to others by oral contact. This includes kissing, sharing a glass, coughing, or sneezing. Touching your mouth or nose and then touching a surface, which is then touched by another person, can also spread the virus. SYMPTOMS  Symptoms typically develop 1 to 3 days after you come in contact with a cold virus. Symptoms vary from person to person. They may include:  Runny nose.  Sneezing.  Nasal congestion.  Sinus irritation.  Sore throat.  Loss of voice (laryngitis).  Cough.  Fatigue.  Muscle aches.  Loss of  appetite.  Headache.  Low-grade fever. DIAGNOSIS  You might diagnose your own cold based on familiar symptoms, since most people get a cold 2 to 3 times a year. Your caregiver can confirm this based on your exam. Most importantly, your caregiver can check that your symptoms are not due to another disease such as strep throat, sinusitis, pneumonia, asthma, or epiglottitis. Blood tests, throat tests, and X-rays are not necessary to diagnose a common cold, but they may sometimes be helpful in excluding other more serious diseases. Your caregiver will decide if any further tests are required. RISKS AND COMPLICATIONS  You may be at risk for a more severe case of the common cold if you smoke cigarettes, have chronic heart disease (such as heart failure) or lung disease (such as asthma), or if you have a weakened immune system. The very young and very old are also at risk for more serious infections. Bacterial sinusitis, middle ear infections, and bacterial pneumonia can complicate the common cold. The common cold can worsen asthma and chronic obstructive pulmonary disease (COPD). Sometimes, these complications can require emergency medical care and may be life-threatening. PREVENTION  The best way to protect against getting a cold is to practice good hygiene. Avoid oral or hand contact with people with cold symptoms. Wash your hands often if contact occurs. There is no clear evidence that vitamin C, vitamin E, echinacea, or exercise reduces the chance of developing a cold. However, it is always recommended to get plenty of rest and practice good nutrition. TREATMENT  Treatment is directed at relieving symptoms. There is no cure. Antibiotics are not effective,  because the infection is caused by a virus, not by bacteria. Treatment may include:  Increased fluid intake. Sports drinks offer valuable electrolytes, sugars, and fluids.  Breathing heated mist or steam (vaporizer or shower).  Eating chicken soup  or other clear broths, and maintaining good nutrition.  Getting plenty of rest.  Using gargles or lozenges for comfort.  Controlling fevers with ibuprofen or acetaminophen as directed by your caregiver.  Increasing usage of your inhaler if you have asthma. Zinc gel and zinc lozenges, taken in the first 24 hours of the common cold, can shorten the duration and lessen the severity of symptoms. Pain medicines may help with fever, muscle aches, and throat pain. A variety of non-prescription medicines are available to treat congestion and runny nose. Your caregiver can make recommendations and may suggest nasal or lung inhalers for other symptoms.  HOME CARE INSTRUCTIONS   Only take over-the-counter or prescription medicines for pain, discomfort, or fever as directed by your caregiver.  Use a warm mist humidifier or inhale steam from a shower to increase air moisture. This may keep secretions moist and make it easier to breathe.  Drink enough water and fluids to keep your urine clear or pale yellow.  Rest as needed.  Return to work when your temperature has returned to normal or as your caregiver advises. You may need to stay home longer to avoid infecting others. You can also use a face mask and careful hand washing to prevent spread of the virus. SEEK MEDICAL CARE IF:   After the first few days, you feel you are getting worse rather than better.  You need your caregiver's advice about medicines to control symptoms.  You develop chills, worsening shortness of breath, or brown or red sputum. These may be signs of pneumonia.  You develop yellow or brown nasal discharge or pain in the face, especially when you bend forward. These may be signs of sinusitis.  You develop a fever, swollen neck glands, pain with swallowing, or white areas in the back of your throat. These may be signs of strep throat. SEEK IMMEDIATE MEDICAL CARE IF:   You have a fever.  You develop severe or persistent  headache, ear pain, sinus pain, or chest pain.  You develop wheezing, a prolonged cough, cough up blood, or have a change in your usual mucus (if you have chronic lung disease).  You develop sore muscles or a stiff neck. Document Released: 10/28/2000 Document Revised: 07/27/2011 Document Reviewed: 08/09/2013 [facility] Patient Information 2015 [place], [place]. This information is not intended to replace advice given to you by your health care provider. Make sure you discuss any questions you have with your health care provider.

## 2015-01-09 NOTE — Assessment & Plan Note (Signed)
Symptoms and exam consistent with upper respiratory infection most likely viral. Continue conservative treatment with over-the-counter medications and supplements as needed for supportive care and symptom relief. Follow-up if symptoms worsen or fail to improve.

## 2015-01-09 NOTE — Progress Notes (Signed)
Pre visit review using our clinic review tool, if applicable. No additional management support is needed unless otherwise documented below in the visit note. 

## 2015-01-11 ENCOUNTER — Telehealth: Payer: Self-pay | Admitting: Family

## 2015-01-11 NOTE — Telephone Encounter (Signed)
Patient is calling to advise that her sinusitis has not improved. She is asking that doxycycline be called in as you had talked about @ her appointment. Pharmacy is [facility] [address].

## 2015-01-14 MED ORDER — DOXYCYCLINE HYCLATE 100 MG PO TABS: 100.0000 mg | ORAL_TABLET | Freq: Two times a day (BID) | ORAL | Status: DC

## 2015-01-14 NOTE — Addendum Note (Signed)
Addended by: Jeanine Luz D on: 01/14/2015 09:37 AM   Modules accepted: Orders

## 2015-01-14 NOTE — Telephone Encounter (Signed)
Doxycycline sent to pharmacy

## 2015-01-31 ENCOUNTER — Ambulatory Visit
Admission: RE | Admit: 2015-01-31 | Discharge: 2015-01-31 | Disposition: A | Discharge status: Home / Self Care | Payer: BLUE CROSS/BLUE SHIELD | Source: Ambulatory Visit | Attending: *Deleted | Admitting: *Deleted

## 2015-01-31 ENCOUNTER — Other Ambulatory Visit: Payer: Self-pay | Admitting: *Deleted

## 2015-01-31 DIAGNOSIS — Z96643 Presence of artificial hip joint, bilateral: Secondary | ICD-10-CM

## 2015-02-05 ENCOUNTER — Other Ambulatory Visit: Payer: Self-pay | Admitting: Family

## 2015-02-21 ENCOUNTER — Ambulatory Visit (HOSPITAL_COMMUNITY)
Admission: RE | Admit: 2015-02-21 | Discharge: 2015-02-21 | Disposition: A | Discharge status: Home / Self Care | Payer: BLUE CROSS/BLUE SHIELD | Source: Ambulatory Visit | Attending: Internal Medicine | Admitting: Internal Medicine

## 2015-02-21 ENCOUNTER — Encounter: Payer: Self-pay | Admitting: Internal Medicine

## 2015-02-21 ENCOUNTER — Other Ambulatory Visit (INDEPENDENT_AMBULATORY_CARE_PROVIDER_SITE_OTHER): Payer: BLUE CROSS/BLUE SHIELD

## 2015-02-21 ENCOUNTER — Ambulatory Visit (INDEPENDENT_AMBULATORY_CARE_PROVIDER_SITE_OTHER): Payer: BLUE CROSS/BLUE SHIELD | Admitting: Internal Medicine

## 2015-02-21 VITALS — BP 122/88 | HR 93 | Resp 16 | Ht 66.0 in | Wt 189.0 lb

## 2015-02-21 DIAGNOSIS — M25562 Pain in left knee: Secondary | ICD-10-CM

## 2015-02-21 DIAGNOSIS — F4323 Adjustment disorder with mixed anxiety and depressed mood: Secondary | ICD-10-CM

## 2015-02-21 DIAGNOSIS — M25561 Pain in right knee: Secondary | ICD-10-CM | POA: Insufficient documentation

## 2015-02-21 DIAGNOSIS — M179 Osteoarthritis of knee, unspecified: Secondary | ICD-10-CM | POA: Insufficient documentation

## 2015-02-21 DIAGNOSIS — R071 Chest pain on breathing: Secondary | ICD-10-CM

## 2015-02-21 LAB — TROPONIN I: TNIDX: 0 ug/l (ref 0.00–0.06)

## 2015-02-21 LAB — BASIC METABOLIC PANEL
BUN: 12 mg/dL (ref 6–23)
CHLORIDE: 101 meq/L (ref 96–112)
CO2: 32 mEq/L (ref 19–32)
Calcium: 9.7 mg/dL (ref 8.4–10.5)
Creatinine, Ser: 0.84 mg/dL (ref 0.40–1.20)
GFR: 77.24 mL/min (ref >60.00)
GLUCOSE: 106 mg/dL — AB (ref 70–99)
POTASSIUM: 4.2 meq/L (ref 3.5–5.1)
Sodium: 139 mEq/L (ref 135–145)

## 2015-02-21 MED ORDER — ESCITALOPRAM OXALATE 10 MG PO TABS: 10.0000 mg | ORAL_TABLET | Freq: Every day | ORAL | Status: DC

## 2015-02-21 NOTE — Progress Notes (Signed)
Pre visit review using our clinic review tool, if applicable. No additional management support is needed unless otherwise documented below in the visit note. 

## 2015-02-21 NOTE — Patient Instructions (Signed)
We have checked the EKG today which is normal.   We are checking blood work for the heart and to check for the blood clot.   We are getting x-rays of the knees and will call you back with the results. We can also send these to the doctor at [facility] as well.

## 2015-02-22 DIAGNOSIS — R071 Chest pain on breathing: Secondary | ICD-10-CM | POA: Insufficient documentation

## 2015-02-22 DIAGNOSIS — M25562 Pain in left knee: Secondary | ICD-10-CM | POA: Insufficient documentation

## 2015-02-22 DIAGNOSIS — M25561 Pain in right knee: Secondary | ICD-10-CM | POA: Insufficient documentation

## 2015-02-22 DIAGNOSIS — F4320 Adjustment disorder, unspecified: Secondary | ICD-10-CM | POA: Insufficient documentation

## 2015-02-22 LAB — D-DIMER, QUANTITATIVE (NOT AT ARMC): D DIMER QUANT: 0.42 ug{FEU}/mL (ref 0.00–0.48)

## 2015-02-22 NOTE — Assessment & Plan Note (Signed)
Rx for lexapro 10 mg daily which she has taken with success in the past. Feel that some of her symptoms could be related. She will think about whether she wants to take this or not.

## 2015-02-22 NOTE — Progress Notes (Signed)
   Subjective:    Patient ID: Kristi Kerr, female    DOB: [birth year], 49 y.o.   MRN: [id number]  HPI The patient is a 49 YO female coming in for acute right and left knee pain. 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 She has suspected for some time that her knees were involved as well and has felt that they are collapsing recently. After her left hip replacement that knee has had a lot of pain and limited her recovery for that hip. She is now not able to walk on it without support. This causes her a lot of mental angst as she is not mobile and is a very active person. She has tried lexapro in the past for adjustment with the hip replacement and diagnoses for this. She still communicates with her specialist at [facility] about the avascular necrosis and he is trying to get her in with a specialist in the area but the waiting list is long. Per her own reports she has been tested for many diseases to look for a cause of her avascular necrosis but none has been found of yet.  Her next concern is pain in her chest especially with deep breathing. She took a trip by car to [place] recently (last 2 weeks) and this is concerning to her. No history of personal or family blood clots. No cough or sinus drainage. No pain with bending or twisting.  Review of Systems  Constitutional: Positive for activity change and fatigue. Negative for fever, appetite change and unexpected weight change.  Respiratory: Negative for cough, chest tightness, shortness of breath and wheezing.   Cardiovascular: Positive for chest pain. Negative for palpitations and leg swelling.  Gastrointestinal: Negative for nausea, abdominal pain, diarrhea, constipation and abdominal distention.  Musculoskeletal: Positive for myalgias, back pain, arthralgias and gait problem.  Skin:  Negative.   Neurological: Negative.   Psychiatric/Behavioral: Positive for sleep disturbance, dysphoric mood and decreased concentration. Negative for suicidal ideas, behavioral problems and self-injury. The patient is not nervous/anxious.       Objective:   Physical Exam  Constitutional: She is oriented to person, place, and time. She appears well-developed and well-nourished.  HENT:  Head: Normocephalic and atraumatic.  Eyes: EOM are normal.  Neck: Normal range of motion.  Cardiovascular: Normal rate and regular rhythm.   Pulmonary/Chest: Effort normal and breath sounds normal. No respiratory distress. She has no wheezes.  Pain on inspiration, no tenderness to touch  Abdominal: Soft. Bowel sounds are normal. She exhibits no distension. There is no tenderness. There is no rebound.  Musculoskeletal: She exhibits no edema.  Neurological: She is alert and oriented to person, place, and time. Coordination normal.  Skin: Skin is warm and dry.  Psychiatric: She has a normal mood and affect.  Sad during the conversation but appropriate.    Filed Vitals:   02/21/15 1036  BP: 122/88  Pulse: 93  Resp: 16  Height:  (1.676 m)  Weight: 189 lb (85.73 kg)  SpO2: 98%   EKG: Rate 74, normal sinus, normal axis and intervals. No ST or T wave changes, when compared to 09/2014 EKG no changes are noted.     Assessment & Plan:

## 2015-02-22 NOTE — Assessment & Plan Note (Signed)
EKG without changes, checking troponin and d-dimer (low risk other than recent travel). Checking BMP in case CT chest is needed to rule out PE. Suspect that some of this is coming from her adjustment problems with these recent and disabling diagnoses.

## 2015-02-22 NOTE — Assessment & Plan Note (Addendum)
Checking x-ray for signs of avascular necrosis. Given the disease she may still need MRI to rule out if x-ray normal. She does have pain control at home already.

## 2015-02-22 NOTE — Assessment & Plan Note (Signed)
Checking x-ray for signs of avascular necrosis. Given the disease if no changes on x-ray she may still need MRI to rule out the process.

## 2015-03-14 ENCOUNTER — Telehealth: Payer: Self-pay | Admitting: *Deleted

## 2015-03-14 MED ORDER — THYROID 90 MG PO TABS: ORAL_TABLET | ORAL | Status: DC

## 2015-03-14 NOTE — Telephone Encounter (Signed)
Left msg on triage stating she is needing refills on her Armour thyroid. Took the last pill this morning. Notified pt [facility]...Raechel Chute/lmb

## 2015-04-11 ENCOUNTER — Other Ambulatory Visit: Payer: Self-pay | Admitting: Family

## 2015-04-18 ENCOUNTER — Telehealth: Payer: Self-pay | Admitting: Family

## 2015-04-18 DIAGNOSIS — E039 Hypothyroidism, unspecified: Secondary | ICD-10-CM

## 2015-04-18 NOTE — Telephone Encounter (Signed)
FYI, pt wan to infrome Kristi Kerr that she is not taking any of her medication at the moment. Pt thinks the pain and hot flash she has is coming from increase in her thyroid, since she stop, she does not have any pain. Pt also want to see if Kristi Kerr can order lab work to check her thyroid on her office visit on 05/06/15.

## 2015-04-18 NOTE — Telephone Encounter (Signed)
Yes, we will recheck her thyroid function on 12/19.

## 2015-04-19 NOTE — Addendum Note (Signed)
Addended by: Mercer PodWRENN, Maclain Cohron E on: 04/19/2015 12:19 PM   Modules accepted: Orders

## 2015-04-19 NOTE — Telephone Encounter (Signed)
Pt wanted the lab work put in already. I put it in for her to get done before OV.

## 2015-05-02 ENCOUNTER — Other Ambulatory Visit (INDEPENDENT_AMBULATORY_CARE_PROVIDER_SITE_OTHER): Payer: BLUE CROSS/BLUE SHIELD

## 2015-05-02 ENCOUNTER — Encounter: Payer: Self-pay | Admitting: Family

## 2015-05-02 DIAGNOSIS — E038 Other specified hypothyroidism: Secondary | ICD-10-CM

## 2015-05-02 LAB — TSH: TSH: 7.57 u[IU]/mL — ABNORMAL HIGH (ref 0.35–4.50)

## 2015-05-06 ENCOUNTER — Encounter: Payer: Self-pay | Admitting: Family

## 2015-05-06 ENCOUNTER — Ambulatory Visit (INDEPENDENT_AMBULATORY_CARE_PROVIDER_SITE_OTHER): Payer: BLUE CROSS/BLUE SHIELD | Admitting: Family

## 2015-05-06 VITALS — BP 128/80 | HR 82 | Temp 98.1°F | Resp 18 | Ht 66.0 in | Wt 192.0 lb

## 2015-05-06 DIAGNOSIS — M25551 Pain in right hip: Secondary | ICD-10-CM | POA: Diagnosis not present

## 2015-05-06 DIAGNOSIS — E039 Hypothyroidism, unspecified: Secondary | ICD-10-CM | POA: Diagnosis not present

## 2015-05-06 MED ORDER — ARMOUR THYROID 60 MG PO TABS: 60.0000 mg | ORAL_TABLET | Freq: Every day | ORAL | Status: DC

## 2015-05-06 NOTE — Assessment & Plan Note (Signed)
Hypothyroidism as uncontrolled as evidenced by TSH of 7.57 which is the result of patient discontinuing medication independently as she was concern for toxicity. No evidence of myxedma. Restart Armour Thyroid with brand product only. Follow-up in 6 weeks for recheck of TSH.

## 2015-05-06 NOTE — Progress Notes (Signed)
Subjective:    Patient ID: Kristi Kerr, female    DOB: 1965/12/29, 49 y.o.   MRN: [id number]  Chief Complaint  Patient presents with  . Follow-up    HPI:  Kristi Kerr is a 49 y.o. female who  has a past medical history of Thyroid disease; AVN (avascular necrosis of bone) (HCC); and Hashimoto's disease. and presents today for a follow up office visit.    1.) Hypothyroidism - Associated symptom of temperature intolerance and changes to skin, hair, and nails since stopping taking her Armour Thyroid.  She believed that she was toxic from the medication and may have been playing a role in her avascular necrosis. Most recent TSH performed was 7.57.   2.) Hip/back pain - This is a new problem. Associated symptom of pain located in her right hip has been going on for about a month. Pain primarily occurs when she stands and notes when she tries to move the right hip after a period of time she is unable to lift her right leg. After moving with assistance she can start moving. She continues to be able to walk on a treadmill and complete her activities of daily living. Frequency has been increasing over the course of the month. She remains in physical therapy for the avascular necrosis and hip replacement. She was recently found to have left hip bursitis.  Allergies  Allergen Reactions  . Codeine Nausea Only  . Latex   . Naproxen Nausea And Vomiting     Current Outpatient Prescriptions on File Prior to Visit  Medication Sig Dispense Refill  . escitalopram (LEXAPRO) 10 MG tablet Take 1 tablet (10 mg total) by mouth daily. 30 tablet 3   No current facility-administered medications on file prior to visit.    Review of Systems  Constitutional: Positive for fatigue. Negative for fever and chills.  Respiratory: Negative for chest tightness and shortness of breath.   Cardiovascular: Negative for chest pain, palpitations and leg swelling.  Endocrine: Positive for cold intolerance.      Objective:      BP 128/80 mmHg  Pulse 82  Temp(Src) 98.1 F (36.7 C) (Oral)  Resp 18  Ht  (1.676 m)  Wt 192 lb (87.091 kg)  BMI 31.00 kg/m2  SpO2 97% Nursing note and vital signs reviewed.  Physical Exam  Constitutional: She is oriented to person, place, and time. She appears well-developed and well-nourished. No distress.  Neck: Neck supple. No thyromegaly present.  Cardiovascular: Normal rate, regular rhythm, normal heart sounds and intact distal pulses.   Pulmonary/Chest: Effort normal and breath sounds normal.  Musculoskeletal:  Right hip - no obvious deformity, discoloration, or edema noted. No tenderness able to be elicited upon palpation. Range of motion is within the normal limits and passive and active range of motion. Strength is 4-5+. Distal pulses, sensation, and reflexes are intact and appropriate. Negative hip scarring. Negative Faber's.  Neurological: She is alert and oriented to person, place, and time.  Skin: Skin is warm and dry.  Psychiatric: She has a normal mood and affect. Her behavior is normal. Judgment and thought content normal.       Assessment & Plan:   Problem List Items Addressed This Visit      Endocrine   Hypothyroidism - Primary    Hypothyroidism as uncontrolled as evidenced by TSH of 7.57 which is the result of patient discontinuing medication independently as she was concern for toxicity. No evidence of myxedma. Restart Armour Thyroid with brand  product only. Follow-up in 6 weeks for recheck of TSH.      Relevant Medications   ARMOUR THYROID 60 MG tablet     Other   Right hip pain    Right hip pain of questionable origin unlikely to be referred pain from sciatica or the lower back. Question possible intra-articular pathology from previous hip replacement. Exam is benign with concern for muscle imbalance. Recommend follow-up with physical therapy and orthopedics. Follow-up with primary care if symptoms worsen or fail to improve.

## 2015-05-06 NOTE — Progress Notes (Signed)
Pre visit review using our clinic review tool, if applicable. No additional management support is needed unless otherwise documented below in the visit note. 

## 2015-05-06 NOTE — Assessment & Plan Note (Signed)
Right hip pain of questionable origin unlikely to be referred pain from sciatica or the lower back. Question possible intra-articular pathology from previous hip replacement. Exam is benign with concern for muscle imbalance. Recommend follow-up with physical therapy and orthopedics. Follow-up with primary care if symptoms worsen or fail to improve.

## 2015-05-06 NOTE — Patient Instructions (Addendum)
Thank you for choosing [facility].  Summary/Instructions:  Your prescription(s) have been submitted to your pharmacy or been printed and provided for you. Please take as directed and contact our office if you believe you are having problem(s) with the medication(s) or have any questions.  If your symptoms worsen or fail to improve, please contact our office for further instruction, or in case of emergency go directly to the emergency room at the closest medical facility.   It does not appear that your back is causing the pain that you are having. There is concern there may be intra-articular issues. Please have physical therapy follow up with assessment of your biomechanics as your function today is normal..  Please restart the Armour Thyroid and follow up in 6 weeks for recheck.

## 2015-07-09 ENCOUNTER — Telehealth: Payer: Self-pay | Admitting: Family

## 2015-07-09 MED ORDER — OSELTAMIVIR PHOSPHATE 75 MG PO CAPS: 75.0000 mg | ORAL_CAPSULE | Freq: Every day | ORAL | Status: DC

## 2015-07-09 NOTE — Telephone Encounter (Signed)
Pt states her son was just diagnosed with the flu and they told her to call here to see if you can send in Tamiflu for her. Pharmacy is [facility] on [address]

## 2015-07-09 NOTE — Telephone Encounter (Signed)
LVM letting pt know.  

## 2015-07-09 NOTE — Telephone Encounter (Signed)
Please advise 

## 2015-07-09 NOTE — Telephone Encounter (Signed)
Medication sent to pharmacy  

## 2015-07-15 ENCOUNTER — Other Ambulatory Visit (INDEPENDENT_AMBULATORY_CARE_PROVIDER_SITE_OTHER): Payer: BLUE CROSS/BLUE SHIELD

## 2015-07-15 ENCOUNTER — Encounter: Payer: Self-pay | Admitting: Family

## 2015-07-15 DIAGNOSIS — E039 Hypothyroidism, unspecified: Secondary | ICD-10-CM

## 2015-07-15 DIAGNOSIS — I519 Heart disease, unspecified: Principal | ICD-10-CM

## 2015-07-15 LAB — TSH: TSH: 2.08 u[IU]/mL (ref 0.35–4.50)

## 2015-07-17 ENCOUNTER — Ambulatory Visit (INDEPENDENT_AMBULATORY_CARE_PROVIDER_SITE_OTHER): Payer: BLUE CROSS/BLUE SHIELD | Admitting: Internal Medicine

## 2015-07-17 ENCOUNTER — Telehealth: Payer: Self-pay | Admitting: Family

## 2015-07-17 ENCOUNTER — Encounter: Payer: Self-pay | Admitting: Internal Medicine

## 2015-07-17 ENCOUNTER — Other Ambulatory Visit (INDEPENDENT_AMBULATORY_CARE_PROVIDER_SITE_OTHER): Payer: BLUE CROSS/BLUE SHIELD

## 2015-07-17 VITALS — BP 108/78 | HR 98 | Temp 98.3°F | Resp 16 | Ht 66.0 in | Wt 193.4 lb

## 2015-07-17 DIAGNOSIS — M8700 Idiopathic aseptic necrosis of unspecified bone: Secondary | ICD-10-CM

## 2015-07-17 DIAGNOSIS — L659 Nonscarring hair loss, unspecified: Secondary | ICD-10-CM | POA: Diagnosis not present

## 2015-07-17 DIAGNOSIS — E039 Hypothyroidism, unspecified: Secondary | ICD-10-CM | POA: Diagnosis not present

## 2015-07-17 LAB — HEPATIC FUNCTION PANEL
ALK PHOS: 117 U/L (ref 39–117)
ALT: 11 U/L (ref 0–35)
AST: 14 U/L (ref 0–37)
Albumin: 4.6 g/dL (ref 3.5–5.2)
BILIRUBIN DIRECT: 0 mg/dL (ref 0.0–0.3)
BILIRUBIN TOTAL: 0.5 mg/dL (ref 0.2–1.2)
Total Protein: 7.6 g/dL (ref 6.0–8.3)

## 2015-07-17 LAB — CBC WITH DIFFERENTIAL/PLATELET
BASOS ABS: 0 10*3/uL (ref 0.0–0.1)
Basophils Relative: 0.3 % (ref 0.0–3.0)
Eosinophils Absolute: 0.1 10*3/uL (ref 0.0–0.7)
Eosinophils Relative: 0.8 % (ref 0.0–5.0)
HEMATOCRIT: 40 % (ref 36.0–46.0)
HEMOGLOBIN: 13.6 g/dL (ref 12.0–15.0)
LYMPHS PCT: 29.9 % (ref 12.0–46.0)
Lymphs Abs: 1.8 10*3/uL (ref 0.7–4.0)
MCHC: 34.1 g/dL (ref 30.0–36.0)
MCV: 87.7 fl (ref 78.0–100.0)
MONOS PCT: 7.1 % (ref 3.0–12.0)
Monocytes Absolute: 0.4 10*3/uL (ref 0.1–1.0)
NEUTROS ABS: 3.7 10*3/uL (ref 1.4–7.7)
NEUTROS PCT: 61.9 % (ref 43.0–77.0)
Platelets: 301 10*3/uL (ref 150.0–400.0)
RBC: 4.56 Mil/uL (ref 3.87–5.11)
RDW: 12.7 % (ref 11.5–15.5)
WBC: 6 10*3/uL (ref 4.0–10.5)

## 2015-07-17 LAB — BASIC METABOLIC PANEL
BUN: 14 mg/dL (ref 6–23)
CALCIUM: 9.9 mg/dL (ref 8.4–10.5)
CO2: 28 meq/L (ref 19–32)
Chloride: 102 mEq/L (ref 96–112)
Creatinine, Ser: 0.77 mg/dL (ref 0.40–1.20)
GFR: 85.25 mL/min (ref >60.00)
Glucose, Bld: 108 mg/dL — ABNORMAL HIGH (ref 70–99)
Potassium: 4.1 mEq/L (ref 3.5–5.1)
SODIUM: 137 meq/L (ref 135–145)

## 2015-07-17 LAB — SEDIMENTATION RATE: Sed Rate: 17 mm/hr (ref 0–22)

## 2015-07-17 LAB — VITAMIN D 25 HYDROXY (VIT D DEFICIENCY, FRACTURES): VITD: 34.43 ng/mL (ref 30.00–100.00)

## 2015-07-17 LAB — VITAMIN B12: VITAMIN B 12: 1042 pg/mL — AB (ref 211–911)

## 2015-07-17 LAB — T4, FREE: Free T4: 0.67 ng/dL (ref 0.60–1.60)

## 2015-07-17 LAB — T3, FREE: T3 FREE: 3.3 pg/mL (ref 2.3–4.2)

## 2015-07-17 NOTE — Progress Notes (Signed)
Subjective:  Patient ID: Kristi Kerr, female    DOB: July 10, 1965  Age: 50 y.o. MRN: [id number]  CC: Acute Visit   HPI Kristi Kerr presents for eyebrows thinning out C/o B hand joints puffiness. C/o wt gain of 60 lbs since 2012 Pt is s/p B THR in 2016 due to AVN 2012 Pt had seen an endocrinologist and had multiple dose  Mother is [place] Father is native [place] and [place] ancestry    Outpatient Prescriptions Prior to Visit  Medication Sig Dispense Refill  . ARMOUR THYROID 60 MG tablet Take 1 tablet (60 mg total) by mouth daily before breakfast. 90 tablet 0  . escitalopram (LEXAPRO) 10 MG tablet Take 1 tablet (10 mg total) by mouth daily. 30 tablet 3  . oseltamivir (TAMIFLU) 75 MG capsule Take 1 capsule (75 mg total) by mouth daily. 10 capsule 0   No facility-administered medications prior to visit.    ROS Review of Systems  Constitutional: Positive for fatigue. Negative for fever, chills, diaphoresis, activity change, appetite change and unexpected weight change.  HENT: Negative for congestion, drooling, ear pain, hearing loss, mouth sores, rhinorrhea and sinus pressure.   Eyes: Negative for photophobia, redness, itching and visual disturbance.  Respiratory: Negative for cough and chest tightness.   Gastrointestinal: Positive for abdominal distention. Negative for nausea, vomiting, abdominal pain, diarrhea, constipation, blood in stool and anal bleeding.  Genitourinary: Negative for dysuria, frequency, difficulty urinating and vaginal pain.  Musculoskeletal: Positive for joint swelling, arthralgias and gait problem. Negative for back pain and neck stiffness.  Skin: Negative for pallor and rash.  Neurological: Negative for dizziness, tremors, weakness, numbness and headaches.  Hematological: Does not bruise/bleed easily.  Psychiatric/Behavioral: Negative for suicidal ideas, confusion and sleep disturbance.    Objective:  BP 108/78 mmHg  Pulse 98  Temp(Src) 98.3 F (36.8 C)  (Oral)  Resp 16  Ht  (1.676 m)  Wt 193 lb 6.4 oz (87.726 kg)  BMI 31.23 kg/m2  SpO2 95%  BP Readings from Last 3 Encounters:  07/17/15 108/78  05/06/15 128/80  02/21/15 122/88    Wt Readings from Last 3 Encounters:  07/17/15 193 lb 6.4 oz (87.726 kg)  05/06/15 192 lb (87.091 kg)  02/21/15 189 lb (85.73 kg)    Physical Exam  Constitutional: She appears well-developed. No distress.  HENT:  Head: Normocephalic.  Right Ear: External ear normal.  Left Ear: External ear normal.  Nose: Nose normal.  Mouth/Throat: Oropharynx is clear and moist.  Eyes: Conjunctivae are normal. Pupils are equal, round, and reactive to light. Right eye exhibits no discharge. Left eye exhibits no discharge.  Neck: Normal range of motion. Neck supple. No JVD present. No tracheal deviation present. No thyromegaly present.  Cardiovascular: Normal rate, regular rhythm and normal heart sounds.   Pulmonary/Chest: No stridor. No respiratory distress. She has no wheezes.  Abdominal: Soft. Bowel sounds are normal. She exhibits no distension and no mass. There is no tenderness. There is no rebound and no guarding.  Musculoskeletal: She exhibits tenderness. She exhibits no edema.  Lymphadenopathy:    She has no cervical adenopathy.  Neurological: She displays normal reflexes. No cranial nerve deficit. She exhibits normal muscle tone. Coordination normal.  Skin: No rash noted. No erythema.  Psychiatric: She has a normal mood and affect. Her behavior is normal. Judgment and thought content normal.  obese Thin eyebrows A little puffy knuckles   Lab Results  Component Value Date   WBC 5.7 10/10/2014   HGB  13.7 10/10/2014   HCT 39.9 10/10/2014   PLT 279.0 10/10/2014   GLUCOSE 106* 02/21/2015   CHOL 185 10/10/2014   TRIG 78.0 10/10/2014   HDL 68.50 10/10/2014   LDLCALC 101* 10/10/2014   ALT 11 10/10/2014   AST 17 10/10/2014   NA 139 02/21/2015   K 4.2 02/21/2015   CL 101 02/21/2015   CREATININE 0.84  02/21/2015   BUN 12 02/21/2015   CO2 32 02/21/2015   TSH 2.08 07/15/2015   INR 1.0 10/10/2014    Dg Knee Complete 4 Views Left  02/21/2015  CLINICAL DATA:  Knee pain.  No known injury.  Initial evaluation EXAM: LEFT KNEE - COMPLETE 4+ VIEW COMPARISON:  None. FINDINGS: Mild medial compartment degenerative change. No acute abnormality. No evidence fracture or dislocation. No evidence of effusion. IMPRESSION: Mild medial compartment degenerative change.  No acute abnormality. Electronically Signed   By: Maisie Fus  Register   On: 02/21/2015 16:12   Dg Knee Complete 4 Views Right  02/21/2015  CLINICAL DATA:  Diffuse LEFT knee pain since June 2016, RIGHT knee pain since Sunday, history of avascular necrosis of the hips EXAM: RIGHT KNEE - COMPLETE 4+ VIEW COMPARISON:  None FINDINGS: Osseous mineralization grossly normal for technique. Joint spaces preserved. No acute fracture, dislocation, or bone destruction. No knee joint effusion or regional soft tissue abnormality. Clothing artifacts at distal thigh. IMPRESSION: No acute osseous abnormalities. Electronically Signed   By: Ulyses Southward M.D.   On: 02/21/2015 16:11   A complex case  Assessment & Plan:   Jasmaine was seen today for acute visit.  Diagnoses and all orders for this visit:  Hypothyroidism, unspecified hypothyroidism type -     Basic metabolic panel; Future -     CBC with Differential/Platelet; Future -     Hepatic function panel; Future -     Vitamin B12; Future -     VITAMIN D 25 Hydroxy (Vit-D Deficiency, Fractures); Future -     T4, free; Future -     Sedimentation rate; Future -     Rheumatoid factor; Future -     ANA; Future -     T3, free; Future -     FSH; Future -     Gliadin antibodies, serum -     Tissue transglutaminase, IgA -     Reticulin Antibody, IgA w reflex titer  AVN (avascular necrosis of bone) (HCC) -     Basic metabolic panel; Future -     CBC with Differential/Platelet; Future -     Hepatic function panel;  Future -     Vitamin B12; Future -     VITAMIN D 25 Hydroxy (Vit-D Deficiency, Fractures); Future -     T4, free; Future -     Sedimentation rate; Future -     Rheumatoid factor; Future -     ANA; Future -     T3, free; Future -     FSH; Future -     Gliadin antibodies, serum -     Tissue transglutaminase, IgA -     Reticulin Antibody, IgA w reflex titer  Alopecia -     Basic metabolic panel; Future -     CBC with Differential/Platelet; Future -     Hepatic function panel; Future -     Vitamin B12; Future -     VITAMIN D 25 Hydroxy (Vit-D Deficiency, Fractures); Future -     T4, free; Future -  Sedimentation rate; Future -     Rheumatoid factor; Future -     ANA; Future -     T3, free; Future -     FSH; Future -     Gliadin antibodies, serum -     Tissue transglutaminase, IgA -     Reticulin Antibody, IgA w reflex titer   I have discontinued Ms. Gutridge's escitalopram and oseltamivir. I am also having her maintain her ARMOUR THYROID.  No orders of the defined types were placed in this encounter.     Follow-up: Return in about 4 weeks (around 08/14/2015) for a follow-up visit.  Sonda Primes, MD

## 2015-07-17 NOTE — Telephone Encounter (Signed)
Pt called regarding the email she had written.  She states her eye brows are falling out and her eyelashes are starting to fall out too. She's having swelling in her hands and her stomach is bloated. She has an appointment with a dermatologist next week.  She thought maybe it was her thyroid, but with the other things that are going on she's not sure.  I can't get her scheduled in the office this week with anyone and she is wanted to know what you think. Can you please give her a call at [phone]

## 2015-07-17 NOTE — Progress Notes (Signed)
Pre visit review using our clinic review tool, if applicable. No additional management support is needed unless otherwise documented below in the visit note. 

## 2015-07-17 NOTE — Assessment & Plan Note (Addendum)
2/17 scalp, eyebrows Labs Pt has a derm appt scheduled

## 2015-07-17 NOTE — Assessment & Plan Note (Signed)
B hips

## 2015-07-17 NOTE — Assessment & Plan Note (Signed)
Chronic. 

## 2015-07-17 NOTE — Telephone Encounter (Signed)
Please advise 

## 2015-07-17 NOTE — Patient Instructions (Addendum)
Gluten free trial (no wheat products) for 4-6 weeks. OK to use gluten-free bread and gluten-free pasta.  Milk free trial (no milk, ice cream, cheese and yogurt) for 4-6 weeks. OK to use almond, coconut, rice or soy milk. "Almond breeze" brand tastes good.  Cortaid for eyebrows

## 2015-07-18 ENCOUNTER — Encounter: Payer: Self-pay | Admitting: Internal Medicine

## 2015-07-18 LAB — ANTI-NUCLEAR AB-TITER (ANA TITER): ANA Titer 1: 1:80 {titer} — ABNORMAL HIGH

## 2015-07-18 LAB — FOLLICLE STIMULATING HORMONE: FSH: 58.3 m[IU]/mL

## 2015-07-18 LAB — RHEUMATOID FACTOR

## 2015-07-18 LAB — ANA: ANA: POSITIVE — AB

## 2015-07-19 NOTE — Telephone Encounter (Signed)
LVM letting pt know.  

## 2015-07-19 NOTE — Telephone Encounter (Signed)
I would suggest follow up with dermatology and it is unlikely related to her thyroid as her last TSH was significantly improved. If the swelling does not improve let us know.

## 2015-08-02 ENCOUNTER — Encounter: Payer: Self-pay | Admitting: Family

## 2015-08-02 MED ORDER — THYROID 15 MG PO TABS: 15.0000 mg | ORAL_TABLET | Freq: Every day | ORAL | Status: DC

## 2015-08-02 NOTE — Telephone Encounter (Signed)
Medication has been sent to pharmacy.  °

## 2015-08-02 NOTE — Telephone Encounter (Signed)
Informed pt on vm °

## 2015-08-02 NOTE — Addendum Note (Signed)
Addended by: Jeanine LuzALONE, GREGORY D on: 08/02/2015 01:01 PM   Modules accepted: Orders

## 2015-08-02 NOTE — Telephone Encounter (Signed)
Pt states her eyebrows are still falling out and she has gone to a dermatologist and the dr has no idea what it could be. She did give her a cream to put on but it is not helping at all. She said that maybe it is her thyroid that is making them fall out since she stopped taking her medication and getting back on.  She is wanting to go back to the 75 mg asap and is requesting a new prescription today. She is still planning on coming next week to her appt to discuss this.

## 2015-08-06 ENCOUNTER — Ambulatory Visit (INDEPENDENT_AMBULATORY_CARE_PROVIDER_SITE_OTHER): Payer: BLUE CROSS/BLUE SHIELD | Admitting: Family

## 2015-08-06 ENCOUNTER — Encounter: Payer: Self-pay | Admitting: Family

## 2015-08-06 VITALS — BP 124/82 | HR 80 | Temp 98.2°F | Resp 16 | Ht 66.0 in | Wt 193.0 lb

## 2015-08-06 DIAGNOSIS — F411 Generalized anxiety disorder: Secondary | ICD-10-CM

## 2015-08-06 DIAGNOSIS — E039 Hypothyroidism, unspecified: Secondary | ICD-10-CM | POA: Diagnosis not present

## 2015-08-06 DIAGNOSIS — F909 Attention-deficit hyperactivity disorder, unspecified type: Secondary | ICD-10-CM | POA: Diagnosis not present

## 2015-08-06 DIAGNOSIS — F988 Other specified behavioral and emotional disorders with onset usually occurring in childhood and adolescence: Secondary | ICD-10-CM

## 2015-08-06 MED ORDER — ESCITALOPRAM OXALATE 10 MG PO TABS: 10.0000 mg | ORAL_TABLET | Freq: Every day | ORAL | Status: DC

## 2015-08-06 MED ORDER — METHYLPHENIDATE HCL ER (OSM) 36 MG PO TBCR: 36.0000 mg | EXTENDED_RELEASE_TABLET | Freq: Every day | ORAL | Status: DC

## 2015-08-06 NOTE — Progress Notes (Signed)
Subjective:    Patient ID: Kristi Kerr, female    DOB: 08/16/1965, 50 y.o.   MRN: [id number]  Chief Complaint  Patient presents with  . Follow-up    She is wanting to talk about her medication, can feel a difference with updated thyroid medication, wants to talk about getting back on other medication that she was on in the past, concerta and lexapro    HPI:  Kristi Kerr is a 50 y.o. female who  has a past medical history of Thyroid disease; AVN (avascular necrosis of bone) (HCC); and Hashimoto's disease. and presents today for a follow up office visit.   1.) Hypothyroidism - Currently maintained on 75 mg of Armour Thyroid most recently increased within the past 4 days. Reports taking the medication as prescribed and notes the side effect of decreased eyebrow growth and cold intolerance. She did experiment with coming off the medication for a drug holiday and noted that she requires the medication otherwise her symptoms return.  Lab Results  Component Value Date   TSH 2.08 07/15/2015    2.) ADD - Previously diagnosed with ADD and maintained on Concerta. Has not taken the medication is several months and has been taking nutritional supplements in attempts to control her attention. While on the medication she was able to sleep and did not have significant weight changes. Now experiencing decreased attention with the nutritional supplements. No chest pain, shortness of breath or heart palpitations.   3.) Anxiety - Previously maintained on Lexapro. Reports not taking the medication for several months in lieu of nutritional supplements. Explains that she is feeling anxiety more and additional stress and does note believe that her nutritional supplements are working.   Allergies  Allergen Reactions  . Aspirin     Other reaction(s): Other (see comments) Stomach irrtation  . Codeine Nausea Only  . Latex   . Naproxen Nausea And Vomiting  . Nsaids Nausea Only  . Oxycodone Nausea And Vomiting       Current Outpatient Prescriptions on File Prior to Visit  Medication Sig Dispense Refill  . ARMOUR THYROID 60 MG tablet Take 1 tablet (60 mg total) by mouth daily before breakfast. 90 tablet 0  . thyroid (ARMOUR THYROID) 15 MG tablet Take 1 tablet (15 mg total) by mouth daily. 30 tablet 1   No current facility-administered medications on file prior to visit.    Past Medical History  Diagnosis Date  . Thyroid disease   . AVN (avascular necrosis of bone) (HCC)   . Hashimoto's disease      Past Surgical History  Procedure Laterality Date  . Total hip arthroplasty    . Cesarean section    . Tubal ligation    . Incontinence surgery    . Right knee surgery    . Shoulder surgery Right     Review of Systems  Constitutional: Negative for fever and chills.  Respiratory: Negative for chest tightness and shortness of breath.   Cardiovascular: Negative for chest pain, palpitations and leg swelling.  Endocrine: Positive for cold intolerance. Negative for heat intolerance.  Psychiatric/Behavioral: Positive for decreased concentration. Negative for suicidal ideas and sleep disturbance. The patient is nervous/anxious.       Objective:    BP 124/82 mmHg  Pulse 80  Temp(Src) 98.2 F (36.8 C) (Oral)  Resp 16  Ht 5\' 6"  (1.676 m)  Wt 193 lb (87.544 kg)  BMI 31.17 kg/m2  SpO2 96% Nursing note and vital signs reviewed.  Physical  Exam  Constitutional: She is oriented to person, place, and time. She appears well-developed and well-nourished. No distress.  Cardiovascular: Normal rate, regular rhythm, normal heart sounds and intact distal pulses.   Pulmonary/Chest: Effort normal and breath sounds normal.  Neurological: She is alert and oriented to person, place, and time.  Skin: Skin is warm and dry.  Psychiatric: Her behavior is normal. Judgment and thought content normal. Her mood appears anxious.       Assessment & Plan:   Problem List Items Addressed This Visit       Endocrine   Hypothyroidism - Primary    Recent increase in Armour thyroid appears to be improving symptoms with most recent TSH at 2. May require more aggressive control with goal TSH of between 0.7-1. Continue current dosage of Armour Thyroid with follow up TSH in 6 weeks.         Other   ADD (attention deficit disorder)    Previously diagnosed with ADD and managed with methylphenidate. Reports stop the medication secondary to attempt a trial of natural supplements to help control her tension. Notes that the symptoms have worsened and the nutritional supplements or no longer controlling her hypertension and her anxiety is also increased. Discussed risks and benefits of medication. She wishes to continue with methylphenidate as previously prescribed. Does require specific generic from [facility]. Start methylphenidate. Follow up in 1 month to determine side effects or need for changes. [place] Controlled Substance Database reviewed with no irregularities.       Relevant Medications   methylphenidate (CONCERTA) 36 MG PO CR tablet   Generalized anxiety disorder    Symptoms consistent with generalized anxiety disorder no longer maintained with nutritional supplementation and previously well controlled with Lexapro. Discussed risks and benefits of medication. Restart Lexapro. Follow-up in one month or sooner if symptoms worsen or do not improve.      Relevant Medications   escitalopram (LEXAPRO) 10 MG tablet

## 2015-08-06 NOTE — Assessment & Plan Note (Signed)
Symptoms consistent with generalized anxiety disorder no longer maintained with nutritional supplementation and previously well controlled with Lexapro. Discussed risks and benefits of medication. Restart Lexapro. Follow-up in one month or sooner if symptoms worsen or do not improve.

## 2015-08-06 NOTE — Patient Instructions (Signed)
Thank you for choosing [facility].  Summary/Instructions:  TSH in 6 weeks.  Start methylphenidate daily.  Start Lexapro daily.   Your prescription(s) have been submitted to your pharmacy or been printed and provided for you. Please take as directed and contact our office if you believe you are having problem(s) with the medication(s) or have any questions.  If your symptoms worsen or fail to improve, please contact our office for further instruction, or in case of emergency go directly to the emergency room at the closest medical facility.

## 2015-08-06 NOTE — Progress Notes (Signed)
Pre visit review using our clinic review tool, if applicable. No additional management support is needed unless otherwise documented below in the visit note. 

## 2015-08-06 NOTE — Assessment & Plan Note (Signed)
Recent increase in Armour thyroid appears to be improving symptoms with most recent TSH at 2. May require more aggressive control with goal TSH of between 0.7-1. Continue current dosage of Armour Thyroid with follow up TSH in 6 weeks.

## 2015-08-06 NOTE — Assessment & Plan Note (Signed)
Previously diagnosed with ADD and managed with methylphenidate. Reports stop the medication secondary to attempt a trial of natural supplements to help control her tension. Notes that the symptoms have worsened and the nutritional supplements or no longer controlling her hypertension and her anxiety is also increased. Discussed risks and benefits of medication. She wishes to continue with methylphenidate as previously prescribed. Does require specific generic from [facility]. Start methylphenidate. Follow up in 1 month to determine side effects or need for changes. [place] Controlled Substance Database reviewed with no irregularities.

## 2015-08-30 ENCOUNTER — Other Ambulatory Visit: Payer: Self-pay | Admitting: Family

## 2015-09-05 ENCOUNTER — Encounter: Payer: Self-pay | Admitting: Family

## 2015-09-05 ENCOUNTER — Other Ambulatory Visit: Payer: Self-pay | Admitting: Family

## 2015-09-05 ENCOUNTER — Other Ambulatory Visit (INDEPENDENT_AMBULATORY_CARE_PROVIDER_SITE_OTHER): Payer: BLUE CROSS/BLUE SHIELD

## 2015-09-05 ENCOUNTER — Ambulatory Visit (INDEPENDENT_AMBULATORY_CARE_PROVIDER_SITE_OTHER): Payer: BLUE CROSS/BLUE SHIELD | Admitting: Family

## 2015-09-05 VITALS — BP 120/80 | HR 86 | Temp 98.4°F | Resp 16 | Ht 66.0 in | Wt 184.0 lb

## 2015-09-05 DIAGNOSIS — E039 Hypothyroidism, unspecified: Secondary | ICD-10-CM | POA: Diagnosis not present

## 2015-09-05 DIAGNOSIS — F909 Attention-deficit hyperactivity disorder, unspecified type: Secondary | ICD-10-CM

## 2015-09-05 DIAGNOSIS — Z0000 Encounter for general adult medical examination without abnormal findings: Secondary | ICD-10-CM | POA: Diagnosis not present

## 2015-09-05 DIAGNOSIS — F411 Generalized anxiety disorder: Secondary | ICD-10-CM | POA: Diagnosis not present

## 2015-09-05 DIAGNOSIS — F988 Other specified behavioral and emotional disorders with onset usually occurring in childhood and adolescence: Secondary | ICD-10-CM

## 2015-09-05 DIAGNOSIS — Z01419 Encounter for gynecological examination (general) (routine) without abnormal findings: Secondary | ICD-10-CM | POA: Insufficient documentation

## 2015-09-05 LAB — TSH: TSH: 0.15 u[IU]/mL — ABNORMAL LOW (ref 0.35–4.50)

## 2015-09-05 MED ORDER — METHYLPHENIDATE HCL ER (OSM) 36 MG PO TBCR: 36.0000 mg | EXTENDED_RELEASE_TABLET | Freq: Every day | ORAL | Status: DC

## 2015-09-05 MED ORDER — ESCITALOPRAM OXALATE 10 MG PO TABS: 10.0000 mg | ORAL_TABLET | Freq: Every day | ORAL | Status: DC

## 2015-09-05 MED ORDER — THYROID 15 MG PO TABS: 15.0000 mg | ORAL_TABLET | Freq: Every day | ORAL | Status: DC

## 2015-09-05 NOTE — Progress Notes (Deleted)
Subjective:     Patient ID: Kristi Kerr, female   DOB: 08/21/1965, 50 y.o.   MRN: [id number]  HPI   Hypothyroidism- Reports feeling much better with growth on eyebrows. Energy level improved with no palpitations or intolerance to cold/heat. Weight loss of 11 lbs since last visit with increased exercise efforts.  Anxiety- Currently maintained on Lexapro. Takes at night to prevent daytime sleepiness. Reports good control of symptoms at this dose.  ADD-Reports taking Concerta as prescribed with improvement in symptoms. Able to concentrate and perform daily functions. [place] Controlled Substance Database...  Gynecology referral requested for pap and mammogram. Prefers female provider.  Requesting referral for cardiologist workup since father died at age 48.  Review of Systems  Constitutional: Negative for fatigue and unexpected weight change.  Respiratory: Negative.  Negative for apnea, chest tightness and shortness of breath.   Cardiovascular: Negative.  Negative for chest pain and palpitations.  Endocrine: Negative.  Negative for cold intolerance and heat intolerance.  Neurological: Negative.  Negative for dizziness, syncope and headaches.       Objective:   Physical Exam  Constitutional: She is oriented to person, place, and time. She appears well-developed and well-nourished.  Cardiovascular: Normal rate, regular rhythm and normal heart sounds.   Pulmonary/Chest: Effort normal and breath sounds normal. No respiratory distress.  Neurological: She is alert and oriented to person, place, and time. She has normal reflexes.       Assessment:     ***    Plan:     ***

## 2015-09-05 NOTE — Assessment & Plan Note (Signed)
ADD appears stable with current regimen and no adverse side effects. She is sleeping well with no unintentional weight loss or cardiac symptoms. Continue current dosage of Concerta. [place] controlled substance database reviewed with no irregularities. Follow-up in 3 months or sooner if needed.

## 2015-09-05 NOTE — Progress Notes (Signed)
Pre visit review using our clinic review tool, if applicable. No additional management support is needed unless otherwise documented below in the visit note. 

## 2015-09-05 NOTE — Patient Instructions (Signed)
Thank you for choosing [facility].  Summary/Instructions:  Please continue to take your medications as prescribed.   Your prescription(s) have been submitted to your pharmacy or been printed and provided for you. Please take as directed and contact our office if you believe you are having problem(s) with the medication(s) or have any questions.  Please stop by the lab on the basement level of the building for your blood work. Your results will be released to MyChart (or called to you) after review, usually within 72 hours after test completion. If any changes need to be made, you will be notified at that same time.  If your symptoms worsen or fail to improve, please contact our office for further instruction, or in case of emergency go directly to the emergency room at the closest medical facility.     

## 2015-09-05 NOTE — Progress Notes (Signed)
Subjective:    Patient ID: Kristi Kerr, female    DOB: 04/27/1966, 50 y.o.   MRN: [id number]  Chief Complaint  Patient presents with  . Follow-up    wants to talk about getting 3 month supply of concerta, states that all meds are working so much better and she feels 100% better    HPI:  Kristi Kerr is a 50 y.o. female who  has a past medical history of Thyroid disease; AVN (avascular necrosis of bone) (HCC); and Hashimoto's disease. and presents today For a follow-up office visit.   1.) Attention deficit - currently maintained on Concerta and reports taken the medication as prescribed without adverse side effects. Currently sleeping approximately 6-8 hours per night. Reports eating well with no significant unintentional weight changes. Denies chest pain, shortness of breath, or heart palpitations.  2.) Hypothyroidism - currently maintained on Armour Thyroid. Reports taking the medication as prescribed and denies adverse side effects. Notes her symptoms are significantly improved since increasing to 75 mg daily.  Lab Results  Component Value Date   TSH 0.15* 09/05/2015    3.) Anxiety -  Currently maintained on Lexapro. Reports taking the medication as prescribed and notes her symptoms are adequately controlled with current medication.No adverse side effects noted.   Allergies  Allergen Reactions  . Aspirin     Other reaction(s): Other (see comments) Stomach irrtation  . Codeine Nausea Only  . Latex   . Naproxen Nausea And Vomiting  . Nsaids Nausea Only  . Oxycodone Nausea And Vomiting     Current Outpatient Prescriptions on File Prior to Visit  Medication Sig Dispense Refill  . ARMOUR THYROID 60 MG tablet TAKE 1 TABLET(60 MG) BY MOUTH DAILY BEFORE BREAKFAST 90 tablet 0  . thyroid (ARMOUR THYROID) 15 MG tablet Take 1 tablet (15 mg total) by mouth daily. 30 tablet 1   No current facility-administered medications on file prior to visit.     Past Surgical History    Procedure Laterality Date  . Total hip arthroplasty    . Cesarean section    . Tubal ligation    . Incontinence surgery    . Right knee surgery    . Shoulder surgery Right     Past Medical History  Diagnosis Date  . Thyroid disease   . AVN (avascular necrosis of bone) (HCC)   . Hashimoto's disease        Review of Systems  Constitutional: Negative for fever and chills.  Eyes:       Negative for changes in vision  Respiratory: Negative for cough, chest tightness and wheezing.   Cardiovascular: Negative for chest pain, palpitations and leg swelling.  Neurological: Negative for dizziness, weakness and light-headedness.  Psychiatric/Behavioral: Negative for sleep disturbance and decreased concentration. The patient is not nervous/anxious.       Objective:    BP 120/80 mmHg  Pulse 86  Temp(Src) 98.4 F (36.9 C) (Oral)  Resp 16  Ht 5\' 6"  (1.676 m)  Wt 184 lb (83.462 kg)  BMI 29.71 kg/m2  SpO2 98% Nursing note and vital signs reviewed.  Physical Exam  Constitutional: She is oriented to person, place, and time. She appears well-developed and well-nourished. No distress.  Cardiovascular: Normal rate, regular rhythm, normal heart sounds and intact distal pulses.   Pulmonary/Chest: Effort normal and breath sounds normal.  Neurological: She is alert and oriented to person, place, and time.  Skin: Skin is warm and dry.  Psychiatric: She has a normal mood  and affect. Her behavior is normal. Judgment and thought content normal.       Assessment & Plan:   Problem List Items Addressed This Visit      Endocrine   Hypothyroidism    Hypothyroidism appears stable and improved symptoms with current dosage. Denies adverse side effects. Obtain TSH to check current status. Continue current dosage of Armour Thyroid pending TSH results.      Relevant Orders   TSH (Completed)     Other   ADD (attention deficit disorder) - Primary    ADD appears stable with current regimen and  no adverse side effects. She is sleeping well with no unintentional weight loss or cardiac symptoms. Continue current dosage of Concerta. [place] controlled substance database reviewed with no irregularities. Follow-up in 3 months or sooner if needed.      Relevant Medications   methylphenidate (CONCERTA) 36 MG PO CR tablet   Generalized anxiety disorder    Generalized anxiety disorder improved with current dosage of Lexapro. No adverse side effects noted. Continue current dosage of Lexapro.      Relevant Medications   escitalopram (LEXAPRO) 10 MG tablet   Well woman exam    Due for well woman exam and mammogram. Patient will like to be referred to gynecology. Referral placed.      Relevant Orders   Ambulatory referral to Gynecology       I am having Ms. Schellhorn maintain her thyroid, ARMOUR THYROID, methylphenidate, and escitalopram.   Meds ordered this encounter  Medications  . DISCONTD: methylphenidate (CONCERTA) 36 MG PO CR tablet    Sig: Take 1 tablet (36 mg total) by mouth daily.    Dispense:  30 tablet    Refill:  0    Must be generic and from [facility]    Order Specific Question:  Supervising Provider    Answer:  Hillard Danker A [[id number]]  . DISCONTD: methylphenidate (CONCERTA) 36 MG PO CR tablet    Sig: Take 1 tablet (36 mg total) by mouth daily.    Dispense:  30 tablet    Refill:  0    Must be generic and from [facility]. Do not fill until 10/06/15    Order Specific Question:  Supervising Provider    Answer:  Hillard Danker A [[id number]]  . methylphenidate (CONCERTA) 36 MG PO CR tablet    Sig: Take 1 tablet (36 mg total) by mouth daily.    Dispense:  30 tablet    Refill:  0    Must be generic and from [facility]. Do not fill until 11/06/15    Order Specific Question:  Supervising Provider    Answer:  Hillard Danker A [[id number]]  . escitalopram (LEXAPRO) 10 MG tablet    Sig: Take 1 tablet (10 mg total) by mouth daily.    Dispense:  90 tablet    Refill:  1     Order Specific Question:  Supervising Provider    Answer:  Hillard Danker A [[id number]]     Follow-up: Return in about 3 months (around 12/05/2015).  Jeanine Luz, FNP

## 2015-09-05 NOTE — Assessment & Plan Note (Signed)
Generalized anxiety disorder improved with current dosage of Lexapro. No adverse side effects noted. Continue current dosage of Lexapro.

## 2015-09-05 NOTE — Assessment & Plan Note (Signed)
Hypothyroidism appears stable and improved symptoms with current dosage. Denies adverse side effects. Obtain TSH to check current status. Continue current dosage of Armour Thyroid pending TSH results.

## 2015-09-05 NOTE — Assessment & Plan Note (Signed)
Due for well woman exam and mammogram. Patient will like to be referred to gynecology. Referral placed.

## 2015-09-29 ENCOUNTER — Other Ambulatory Visit: Payer: Self-pay | Admitting: Family

## 2015-10-10 ENCOUNTER — Other Ambulatory Visit: Payer: Self-pay | Admitting: Obstetrics & Gynecology

## 2015-10-10 DIAGNOSIS — Z1231 Encounter for screening mammogram for malignant neoplasm of breast: Secondary | ICD-10-CM

## 2015-10-23 ENCOUNTER — Ambulatory Visit
Admission: RE | Admit: 2015-10-23 | Discharge: 2015-10-23 | Disposition: A | Discharge status: Home / Self Care | Payer: BLUE CROSS/BLUE SHIELD | Source: Ambulatory Visit | Attending: Obstetrics & Gynecology | Admitting: Obstetrics & Gynecology

## 2015-10-23 DIAGNOSIS — Z1231 Encounter for screening mammogram for malignant neoplasm of breast: Secondary | ICD-10-CM

## 2015-11-13 ENCOUNTER — Telehealth: Payer: Self-pay | Admitting: Family

## 2015-11-13 NOTE — Telephone Encounter (Signed)
Please inform patient that we can refill her medications prior to her appointment. Which meds is she needing refilled? As for the tick bites, it generally will take approximately 48-72 hours before Lyme disease is transmitted. Therefore if she has no symptoms currently I think that it is okay to wait until the appointment.

## 2015-11-13 NOTE — Telephone Encounter (Signed)
Pt called to move up her follow up appt in the month of July because she will run out of meds if she waits until the end of the month to be seen.  Pt also stated she was bit by two ticks a few weeks ago:  One tick was on her skin for at least a day before noticing it and removing it.  It was on her back and she still has a mark where it was.  The other bite was on her shoulder and it did not leave a mark.  She has not felt bad but has concerns about Lyme disease.  She wants to know if she should be seen before her next appt with Tammy SoursGreg based on these findings.  Please call pt to let her know.

## 2015-11-14 NOTE — Telephone Encounter (Signed)
Tried calling pt. LVM for her to call back. 

## 2015-11-20 ENCOUNTER — Other Ambulatory Visit: Payer: Self-pay | Admitting: *Deleted

## 2015-11-20 MED ORDER — ARMOUR THYROID 15 MG PO TABS: ORAL_TABLET | ORAL | Status: DC

## 2015-11-20 MED ORDER — ARMOUR THYROID 60 MG PO TABS: ORAL_TABLET | ORAL | Status: DC

## 2015-11-20 NOTE — Telephone Encounter (Signed)
Left msg on triage stating needi g to get her thyroid medication refill early will be going out of town on Sat. Requesting refill to be sent to pharmacy.Called pt no answer LMOM rx sent to [facility]...Raechel Chute/lmb

## 2015-12-02 ENCOUNTER — Encounter: Payer: Self-pay | Admitting: Family

## 2015-12-02 ENCOUNTER — Ambulatory Visit (INDEPENDENT_AMBULATORY_CARE_PROVIDER_SITE_OTHER): Payer: BLUE CROSS/BLUE SHIELD | Admitting: Family

## 2015-12-02 ENCOUNTER — Other Ambulatory Visit (INDEPENDENT_AMBULATORY_CARE_PROVIDER_SITE_OTHER): Payer: BLUE CROSS/BLUE SHIELD

## 2015-12-02 VITALS — BP 120/68 | HR 99 | Temp 98.2°F | Resp 20 | Wt 183.0 lb

## 2015-12-02 DIAGNOSIS — E039 Hypothyroidism, unspecified: Secondary | ICD-10-CM

## 2015-12-02 DIAGNOSIS — F988 Other specified behavioral and emotional disorders with onset usually occurring in childhood and adolescence: Secondary | ICD-10-CM

## 2015-12-02 DIAGNOSIS — M722 Plantar fascial fibromatosis: Secondary | ICD-10-CM | POA: Insufficient documentation

## 2015-12-02 DIAGNOSIS — F909 Attention-deficit hyperactivity disorder, unspecified type: Secondary | ICD-10-CM | POA: Diagnosis not present

## 2015-12-02 LAB — TSH: TSH: 0.19 u[IU]/mL — AB (ref 0.35–4.50)

## 2015-12-02 MED ORDER — METHYLPHENIDATE HCL ER (OSM) 36 MG PO TBCR: 36.0000 mg | EXTENDED_RELEASE_TABLET | Freq: Every day | ORAL | Status: DC

## 2015-12-02 NOTE — Assessment & Plan Note (Signed)
Stable with current dosage of Armour Thyroid with no adverse side effects. Obtain TSH to determine current status. Continue current dosage of at Armour Thyroid pending TSH results.

## 2015-12-02 NOTE — Progress Notes (Signed)
Subjective:    Patient ID: Kristi Kerr, female    DOB: August 25, 1965, 50 y.o.   MRN: [id number]  Chief Complaint  Patient presents with  . Hypothyroidism  . Foot Pain    HPI:  Kristi Kerr is a 50 y.o. female who  has a past medical history of Thyroid disease; AVN (avascular necrosis of bone) (HCC); and Hashimoto's disease. and presents today for a follow up office visit.   1.) Hypothyroidism - Currently maintained on Armour thryoid. Reports taking the medication as prescribed. Denies adverse side effects.  Lab Results  Component Value Date   TSH 0.15* 09/05/2015    2.) ADD - Currently maintained on methylphenidate. Reports taking medication as prescribed and denies adverse side effects. Notes her symptoms radically controlled with current regimen. Sleeping approximately 6-8 hours per night. No significant appetite or weight changes. No chest pain, shortness of breath, or heart palpitations.  3.) Foot pain - This is a new problem. Associated symptom of pain located in her bilateral feet has been going on for several weeks. Described as burning. Timing of the symptoms is generally in the morning and eased up with walking. Pain has improved slightly. Primarily located on the bottom of the feet.    Allergies  Allergen Reactions  . Aspirin     Other reaction(s): Other (see comments) Stomach irrtation  . Codeine Nausea Only  . Latex   . Naproxen Nausea And Vomiting  . Nsaids Nausea Only  . Oxycodone Nausea And Vomiting     Current Outpatient Prescriptions on File Prior to Visit  Medication Sig Dispense Refill  . ARMOUR THYROID 15 MG tablet TAKE 1 tablet by mouth along with 60 mg daily 90 tablet 1  . ARMOUR THYROID 60 MG tablet Take 1 tablets along with the 15 mg by mouth daily 90 tablet 1  . escitalopram (LEXAPRO) 10 MG tablet Take 1 tablet (10 mg total) by mouth daily. 90 tablet 1  . thyroid (ARMOUR THYROID) 15 MG tablet Take 1 tablet (15 mg total) by mouth daily. 90 tablet 0    No current facility-administered medications on file prior to visit.      Past Surgical History  Procedure Laterality Date  . Total hip arthroplasty    . Cesarean section    . Tubal ligation    . Incontinence surgery    . Right knee surgery    . Shoulder surgery Right     Past Medical History  Diagnosis Date  . Thyroid disease   . AVN (avascular necrosis of bone) (HCC)   . Hashimoto's disease     Review of Systems  Constitutional: Negative for fever and chills.  Respiratory: Negative for cough, chest tightness and wheezing.   Cardiovascular: Negative for chest pain, palpitations and leg swelling.  Neurological: Negative for dizziness, weakness and light-headedness.      Objective:    BP 120/68 mmHg  Pulse 99  Temp(Src) 98.2 F (36.8 C) (Oral)  Resp 20  Wt 183 lb (83.008 kg)  SpO2 98% Nursing note and vital signs reviewed.  Physical Exam  Constitutional: She is oriented to person, place, and time. She appears well-developed and well-nourished. No distress.  Cardiovascular: Normal rate, regular rhythm, normal heart sounds and intact distal pulses.   Pulmonary/Chest: Effort normal and breath sounds normal.  Musculoskeletal:  Bilateral feet - no obvious deformity, discoloration, or edema. Mild tenderness elicited over plantar fascia. There is decreased range of motion in dorsiflexion secondary to gastrocnemius tightness. Pulses are  intact and appropriate.  Neurological: She is alert and oriented to person, place, and time.  Skin: Skin is warm and dry.  Psychiatric: She has a normal mood and affect. Her behavior is normal. Judgment and thought content normal.       Assessment & Plan:   Problem List Items Addressed This Visit      Endocrine   Hypothyroidism    Stable with current dosage of Armour Thyroid with no adverse side effects. Obtain TSH to determine current status. Continue current dosage of at Armour Thyroid pending TSH results.      Relevant Orders    TSH     Musculoskeletal and Integument   Bilateral plantar fasciitis    Symptoms and exam consistent with bilateral planter fasciitis. This is most likely related to tight gastrocnemius is as dorsi flexion is limited. Treat conservatively with home exercise therapy and ice massage. Encouraged to wear good supportive shoes. Follow-up if symptoms worsen or do not improve.        Other   ADD (attention deficit disorder) - Primary    ADD appears stable with current regimen and no adverse side effects. Sleeping approximate 6-8 hours per night with no significant changes in weight or appetite. No cardiac symptoms. [place] controlled substance database reviewed with no irregularities. Continue current dosage of methylphenidate. Follow-up in 3 months.      Relevant Medications   methylphenidate (CONCERTA) 36 MG PO CR tablet       I am having Kristi Kerr maintain her escitalopram, thyroid, ARMOUR THYROID, ARMOUR THYROID, and methylphenidate.   Meds ordered this encounter  Medications  . DISCONTD: methylphenidate (CONCERTA) 36 MG PO CR tablet    Sig: Take 1 tablet (36 mg total) by mouth daily.    Dispense:  30 tablet    Refill:  0    Must be generic and from [facility]. Do not fill until 12/06/15    Order Specific Question:  Supervising Provider    Answer:  Hillard DankerRAWFORD, ELIZABETH A [[id number]]  . DISCONTD: methylphenidate (CONCERTA) 36 MG PO CR tablet    Sig: Take 1 tablet (36 mg total) by mouth daily.    Dispense:  30 tablet    Refill:  0    Must be generic and from [facility]. Do not fill until 01/06/16    Order Specific Question:  Supervising Provider    Answer:  Hillard DankerRAWFORD, ELIZABETH A [[id number]]  . DISCONTD: methylphenidate (CONCERTA) 36 MG PO CR tablet    Sig: Take 1 tablet (36 mg total) by mouth daily.    Dispense:  30 tablet    Refill:  0    Must be generic and from [facility]. Do not fill until 02/06/16    Order Specific Question:  Supervising Provider    Answer:  Hillard DankerRAWFORD, ELIZABETH A  [[id number]]  . methylphenidate (CONCERTA) 36 MG PO CR tablet    Sig: Take 1 tablet (36 mg total) by mouth daily.    Dispense:  30 tablet    Refill:  0    Must be generic and from [facility]. Do not fill until 12/04/15    Order Specific Question:  Supervising Provider    Answer:  Hillard DankerRAWFORD, ELIZABETH A [[id number]]     Follow-up: Return in about 3 months (around 03/03/2016), or if symptoms worsen or fail to improve.  Jeanine Luzalone, Gregory, FNP

## 2015-12-02 NOTE — Assessment & Plan Note (Signed)
ADD appears stable with current regimen and no adverse side effects. Sleeping approximate 6-8 hours per night with no significant changes in weight or appetite. No cardiac symptoms. [place] controlled substance database reviewed with no irregularities. Continue current dosage of methylphenidate. Follow-up in 3 months.

## 2015-12-02 NOTE — Progress Notes (Signed)
Pre visit review using our clinic review tool, if applicable. No additional management support is needed unless otherwise documented below in the visit note. 

## 2015-12-02 NOTE — Assessment & Plan Note (Signed)
Symptoms and exam consistent with bilateral planter fasciitis. This is most likely related to tight gastrocnemius is as dorsi flexion is limited. Treat conservatively with home exercise therapy and ice massage. Encouraged to wear good supportive shoes. Follow-up if symptoms worsen or do not improve.

## 2015-12-02 NOTE — Patient Instructions (Addendum)
Thank you for choosing [facility].  Summary/Instructions:  Please continue to take your medications as prescribed.   Your prescription(s) have been submitted to your pharmacy or been printed and provided for you. Please take as directed and contact our office if you believe you are having problem(s) with the medication(s) or have any questions.  Please stop by the lab on the lower level of the building for your blood work. Your results will be released to MyChart (or called to you) after review, usually within 72 hours after test completion. If any changes need to be made, you will be notified at that same time.  1. The lab is open from 7:30am to 5:30 pm Monday-Friday  2. No appointment is necessary  3. Fasting (if needed) is 6-8 hours after food and drink; black coffee  and water are okay   If your symptoms worsen or fail to improve, please contact our office for further instruction, or in case of emergency go directly to the emergency room at the closest medical facility.   Plantar Fasciitis Plantar fasciitis is a painful foot condition that affects the heel. It occurs when the band of tissue that connects the toes to the heel bone (plantar fascia) becomes irritated. This can happen after exercising too much or doing other repetitive activities (overuse injury). The pain from plantar fasciitis can range from mild irritation to severe pain that makes it difficult for you to walk or move. The pain is usually worse in the morning or after you have been sitting or lying down for a while. CAUSES This condition may be caused by:  Standing for long periods of time.  Wearing shoes that do not fit.  Doing high-impact activities, including running, aerobics, and ballet.  Being overweight.  Having an abnormal way of walking (gait).  Having tight calf muscles.  Having high arches in your feet.  Starting a new athletic activity. SYMPTOMS The main symptom of this condition is heel  pain. Other symptoms include:  Pain that gets worse after activity or exercise.  Pain that is worse in the morning or after resting.  Pain that goes away after you walk for a few minutes. DIAGNOSIS This condition may be diagnosed based on your signs and symptoms. Your health care provider will also do a physical exam to check for:  A tender area on the bottom of your foot.  A high arch in your foot.  Pain when you move your foot.  Difficulty moving your foot. You may also need to have imaging studies to confirm the diagnosis. These can include:  X-rays.  Ultrasound.  MRI. TREATMENT  Treatment for plantar fasciitis depends on the severity of the condition. Your treatment may include:  Rest, ice, and over-the-counter pain medicines to manage your pain.  Exercises to stretch your calves and your plantar fascia.  A splint that holds your foot in a stretched, upward position while you sleep (night splint).  Physical therapy to relieve symptoms and prevent problems in the future.  Cortisone injections to relieve severe pain.  Extracorporeal shock wave therapy (ESWT) to stimulate damaged plantar fascia with electrical impulses. It is often used as a last resort before surgery.  Surgery, if other treatments have not worked after 12 months. HOME CARE INSTRUCTIONS  Take medicines only as directed by your health care provider.  Avoid activities that cause pain.  Roll the bottom of your foot over a bag of ice or a bottle of cold water. Do this for 20  minutes, 3-4 times a day.  Perform simple stretches as directed by your health care provider.  Try wearing athletic shoes with air-sole or gel-sole cushions or soft shoe inserts.  Wear a night splint while sleeping, if directed by your health care provider.  Keep all follow-up appointments with your health care provider. PREVENTION   Do not perform exercises or activities that cause heel pain.  Consider finding low-impact  activities if you continue to have problems.  Lose weight if you need to. The best way to prevent plantar fasciitis is to avoid the activities that aggravate your plantar fascia. SEEK MEDICAL CARE IF:  Your symptoms do not go away after treatment with home care measures.  Your pain gets worse.  Your pain affects your ability to move or do your daily activities.   This information is not intended to replace advice given to you by your health care provider. Make sure you discuss any questions you have with your health care provider.   Document Released: 01/27/2001 Document Revised: 01/23/2015 Document Reviewed: 03/14/2014 Elsevier Interactive Patient Education [employer]2016 [employer] Inc.

## 2015-12-11 ENCOUNTER — Ambulatory Visit: Payer: BLUE CROSS/BLUE SHIELD | Admitting: Family

## 2016-03-04 ENCOUNTER — Encounter: Payer: Self-pay | Admitting: Family

## 2016-03-04 ENCOUNTER — Other Ambulatory Visit (INDEPENDENT_AMBULATORY_CARE_PROVIDER_SITE_OTHER): Payer: BLUE CROSS/BLUE SHIELD

## 2016-03-04 ENCOUNTER — Ambulatory Visit (INDEPENDENT_AMBULATORY_CARE_PROVIDER_SITE_OTHER): Payer: BLUE CROSS/BLUE SHIELD | Admitting: Family

## 2016-03-04 VITALS — BP 134/88 | HR 78 | Temp 98.6°F | Resp 16 | Ht 66.0 in | Wt 175.0 lb

## 2016-03-04 DIAGNOSIS — E039 Hypothyroidism, unspecified: Secondary | ICD-10-CM

## 2016-03-04 DIAGNOSIS — F902 Attention-deficit hyperactivity disorder, combined type: Secondary | ICD-10-CM

## 2016-03-04 LAB — TSH: TSH: 1.48 u[IU]/mL (ref 0.35–4.50)

## 2016-03-04 MED ORDER — METHYLPHENIDATE HCL ER (OSM) 36 MG PO TBCR: 36.0000 mg | EXTENDED_RELEASE_TABLET | Freq: Every day | ORAL | 0 refills | Status: DC

## 2016-03-04 NOTE — Assessment & Plan Note (Signed)
ADD appears stable with current regimen and no adverse side effects. Sleeping and eating well with no cardiac symptoms. Attention is well controlled. [place] controlled substance database reviewed with no irregularities. Continue current dosage of methylphenidate.

## 2016-03-04 NOTE — Patient Instructions (Signed)
Thank you for choosing [facility].  SUMMARY AND INSTRUCTIONS:  Medication:  Please continue to take the medication as prescribed.   Your prescription(s) have been submitted to your pharmacy or been printed and provided for you. Please take as directed and contact our office if you believe you are having problem(s) with the medication(s) or have any questions.  Labs:  Please stop by the lab on the lower level of the building for your blood work. Your results will be released to MyChart (or called to you) after review, usually within 72 hours after test completion. If any changes need to be made, you will be notified at that same time.  1.) The lab is open from 7:30am to 5:30 pm Monday-Friday 2.) No appointment is necessary 3.) Fasting (if needed) is 6-8 hours after food and drink; black coffee and water are okay   Follow up:  If your symptoms worsen or fail to improve, please contact our office for further instruction, or in case of emergency go directly to the emergency room at the closest medical facility.

## 2016-03-04 NOTE — Progress Notes (Signed)
Subjective:    Patient ID: Kristi Kerr, female    DOB: 10/26/1965, 50 y.o.   MRN: [id number]  Chief Complaint  Patient presents with  . Follow-up    HPI:  Kristi Kerr is a 50 y.o. female who  has a past medical history of AVN (avascular necrosis of bone) (HCC); Hashimoto's disease; and Thyroid disease. and presents today for a follow up office vist.   1.) ADD - Currently maintained on methylphenidate. Reports taking the medication as prescribed and denies adverse side effects. Symptoms are generally well controlled with medication regimen. Sleeps approximate 6-8 hours per night. Denies appetite or weight changes. No chest pain, shortness of breath, or heart palpitations.  2.) Hypothyroidism - Currently maintained on Armour thyroid and has reduced to 60 mg from the previously prescribed 75 mg. Denies adverse side effects of medication or fatigue, weight gain, temperature intolerance, or changes to skin/hair/nails.    Allergies  Allergen Reactions  . Aspirin     Other reaction(s): Other (see comments) Stomach irrtation  . Codeine Nausea Only  . Latex   . Naproxen Nausea And Vomiting  . Nsaids Nausea Only  . Oxycodone Nausea And Vomiting      Outpatient Medications Prior to Visit  Medication Sig Dispense Refill  . ARMOUR THYROID 60 MG tablet Take 1 tablets along with the 15 mg by mouth daily 90 tablet 1  . escitalopram (LEXAPRO) 10 MG tablet Take 1 tablet (10 mg total) by mouth daily. 90 tablet 1  . methylphenidate (CONCERTA) 36 MG PO CR tablet Take 1 tablet (36 mg total) by mouth daily. 30 tablet 0  . thyroid (ARMOUR THYROID) 15 MG tablet Take 1 tablet (15 mg total) by mouth daily. 90 tablet 0  . ARMOUR THYROID 15 MG tablet TAKE 1 tablet by mouth along with 60 mg daily 90 tablet 1   No facility-administered medications prior to visit.       Past Surgical History:  Procedure Laterality Date  . CESAREAN SECTION    . INCONTINENCE SURGERY    . right knee surgery    .  SHOULDER SURGERY Right   . TOTAL HIP ARTHROPLASTY    . TUBAL LIGATION        Past Medical History:  Diagnosis Date  . AVN (avascular necrosis of bone) (HCC)   . Hashimoto's disease   . Thyroid disease       Review of Systems  Constitutional: Negative for chills and fever.  Respiratory: Negative for chest tightness and shortness of breath.   Cardiovascular: Negative for chest pain, palpitations and leg swelling.  Neurological: Negative for weakness, numbness and headaches.  Psychiatric/Behavioral: Negative for decreased concentration. The patient is not nervous/anxious.       Objective:    BP 134/88 (BP Location: Left Arm, Patient Position: Sitting, Cuff Size: Normal)   Pulse 78   Temp 98.6 F (37 C) (Oral)   Resp 16   Ht 5\' 6"  (1.676 m)   Wt 175 lb (79.4 kg)   SpO2 97%   BMI 28.25 kg/m  Nursing note and vital signs reviewed.  Physical Exam  Constitutional: She is oriented to person, place, and time. She appears well-developed and well-nourished. No distress.  Eyes: Conjunctivae and EOM are normal. Pupils are equal, round, and reactive to light.  Neck: Neck supple. No thyromegaly present.  Cardiovascular: Normal rate, regular rhythm, normal heart sounds and intact distal pulses.   Pulmonary/Chest: Effort normal and breath sounds normal. She has no wheezes.  She has no rales. She exhibits no tenderness.  Neurological: She is alert and oriented to person, place, and time. She has normal reflexes.  Skin: Skin is warm and dry.  Psychiatric: She has a normal mood and affect. Her behavior is normal. Judgment and thought content normal.       Assessment & Plan:   Problem List Items Addressed This Visit      Endocrine   Hypothyroidism - Primary    Self decreased Armour Thyroid to 60 mg daily. No adverse side effects or symptoms of hypothyroidism present on exam. Obtain TSH. Continue current dosage of Armour Thyroid pending TSH results.      Relevant Orders   TSH  (Completed)     Other   ADD (attention deficit disorder)    ADD appears stable with current regimen and no adverse side effects. Sleeping and eating well with no cardiac symptoms. Attention is well controlled. [place] controlled substance database reviewed with no irregularities. Continue current dosage of methylphenidate.      Relevant Medications   methylphenidate (CONCERTA) 36 MG PO CR tablet    Other Visit Diagnoses   None.      I have discontinued Ms. Reeder's thyroid. I am also having her maintain her escitalopram, ARMOUR THYROID, and methylphenidate.   Meds ordered this encounter  Medications  . DISCONTD: methylphenidate (CONCERTA) 36 MG PO CR tablet    Sig: Take 1 tablet (36 mg total) by mouth daily.    Dispense:  30 tablet    Refill:  0    Must be generic and from [facility].    Order Specific Question:   Supervising Provider    Answer:   Hillard Danker A [[id number]]  . DISCONTD: methylphenidate (CONCERTA) 36 MG PO CR tablet    Sig: Take 1 tablet (36 mg total) by mouth daily.    Dispense:  30 tablet    Refill:  0    Must be generic and from [facility]. Fill on or after 04/04/16    Order Specific Question:   Supervising Provider    Answer:   Hillard Danker A [[id number]]  . methylphenidate (CONCERTA) 36 MG PO CR tablet    Sig: Take 1 tablet (36 mg total) by mouth daily.    Dispense:  30 tablet    Refill:  0    Must be generic and from [facility]. Fill on or after 05/03/16    Order Specific Question:   Supervising Provider    Answer:   Hillard Danker A [[id number]]     Follow-up: Return in about 3 months (around 06/04/2016), or if symptoms worsen or fail to improve.  Jeanine Luz, FNP

## 2016-03-04 NOTE — Assessment & Plan Note (Signed)
Self decreased Armour Thyroid to 60 mg daily. No adverse side effects or symptoms of hypothyroidism present on exam. Obtain TSH. Continue current dosage of Armour Thyroid pending TSH results.

## 2016-04-28 ENCOUNTER — Ambulatory Visit: Payer: BLUE CROSS/BLUE SHIELD | Admitting: Family

## 2016-04-29 ENCOUNTER — Ambulatory Visit (INDEPENDENT_AMBULATORY_CARE_PROVIDER_SITE_OTHER): Payer: BLUE CROSS/BLUE SHIELD | Admitting: Family

## 2016-04-29 ENCOUNTER — Other Ambulatory Visit: Payer: Self-pay | Admitting: Family

## 2016-04-29 ENCOUNTER — Encounter: Payer: Self-pay | Admitting: Family

## 2016-04-29 VITALS — BP 120/80 | HR 81 | Temp 98.1°F | Resp 16 | Ht 66.0 in | Wt 168.0 lb

## 2016-04-29 DIAGNOSIS — F411 Generalized anxiety disorder: Secondary | ICD-10-CM

## 2016-04-29 DIAGNOSIS — E039 Hypothyroidism, unspecified: Secondary | ICD-10-CM | POA: Diagnosis not present

## 2016-04-29 DIAGNOSIS — F902 Attention-deficit hyperactivity disorder, combined type: Secondary | ICD-10-CM | POA: Diagnosis not present

## 2016-04-29 MED ORDER — METHYLPHENIDATE HCL ER (OSM) 36 MG PO TBCR: 36.0000 mg | EXTENDED_RELEASE_TABLET | Freq: Every day | ORAL | 0 refills | Status: DC

## 2016-04-29 MED ORDER — METHYLPHENIDATE HCL ER (OSM) 36 MG PO TBCR: 36.0000 mg | EXTENDED_RELEASE_TABLET | Freq: Every day | ORAL | 0 refills | Status: AC

## 2016-04-29 MED ORDER — ESCITALOPRAM OXALATE 10 MG PO TABS: 10.0000 mg | ORAL_TABLET | Freq: Every day | ORAL | 1 refills | Status: AC

## 2016-04-29 MED ORDER — ARMOUR THYROID 60 MG PO TABS: ORAL_TABLET | ORAL | 1 refills | Status: AC

## 2016-04-29 NOTE — Assessment & Plan Note (Signed)
Generalized anxiety disorder appears well controlled with current regimen and no adverse side effects. Indicates that her stress levels continue to remain elevated as [place] [facility] [place] [facility] Continue current dosage of escitalopram. Continue to monitor.

## 2016-04-29 NOTE — Patient Instructions (Addendum)
Thank you for choosing [facility].  SUMMARY AND INSTRUCTIONS:  Best wishes for future health!  Medication:  Please continue to take your medications as prescribed.   Your prescription(s) have been submitted to your pharmacy or been printed and provided for you. Please take as directed and contact our office if you believe you are having problem(s) with the medication(s) or have any questions.  Follow up:  If your symptoms worsen or fail to improve, please contact our office for further instruction, or in case of emergency go directly to the emergency room at the closest medical facility.

## 2016-04-29 NOTE — Assessment & Plan Note (Signed)
ADD appears well controlled with the current medication regimen and no adverse side effects. Eating and sleeping well with no cardiac symptoms.  Controlled Substance Database reviewed with no irregularities. Continue current dosage of Concerta. [place] and will be finding new primary care upon arrival.

## 2016-04-29 NOTE — Progress Notes (Signed)
Subjective:    Patient ID: Kristi Kerr, female    DOB: 06/19/1965, 50 y.o.   MRN: [id number]  Chief Complaint  Patient presents with  . Follow-up    Refills of medication moving    HPI:  Kristi Kerr is a 50 y.o. female who  has a past medical history of AVN (avascular necrosis of bone) (HCC); Hashimoto's disease; and Thyroid disease. and presents today for a follow up office visit.   1.) Hypothyroidism - currently maintained on Armour Thyroid. Reports taking medication as prescribed and denies adverse side effects. Symptoms are generally well controlled with the current medication regimen.   Lab Results  Component Value Date   TSH 1.48 03/04/2016     2.) ADD / ADHD - Currently maintained on Concerta. Report taking the medication as prescribed and denies adverse side effects. Attention is generally well controlled with the current regimen. She continues to be on a generic from [facility]. Sleeping about 6-8 hours per night. No significant changes to weight or appetite. No chest pain, shortness of breath or heart palpitations.   3.) Anxiety - Currently maintained on escitalopram. Reports taking the medication as prescribed and denies adverse side effects. Anxiety is well controlled with the current regimen.    Allergies  Allergen Reactions  . Aspirin     Other reaction(s): Other (see comments) Stomach irrtation  . Codeine Nausea Only  . Latex   . Naproxen Nausea And Vomiting  . Nsaids Nausea Only  . Oxycodone Nausea And Vomiting      Outpatient Medications Prior to Visit  Medication Sig Dispense Refill  . ARMOUR THYROID 60 MG tablet Take 1 tablets along with the 15 mg by mouth daily 90 tablet 1  . escitalopram (LEXAPRO) 10 MG tablet Take 1 tablet (10 mg total) by mouth daily. 90 tablet 1  . methylphenidate (CONCERTA) 36 MG PO CR tablet Take 1 tablet (36 mg total) by mouth daily. 30 tablet 0   No facility-administered medications prior to visit.      Past Surgical  History:  Procedure Laterality Date  . CESAREAN SECTION    . INCONTINENCE SURGERY    . right knee surgery    . SHOULDER SURGERY Right   . TOTAL HIP ARTHROPLASTY    . TUBAL LIGATION      Past Medical History:  Diagnosis Date  . AVN (avascular necrosis of bone) (HCC)   . Hashimoto's disease   . Thyroid disease      Review of Systems  Constitutional: Negative for chills and fever.  Eyes:       Negative for changes in vision  Respiratory: Negative for cough, chest tightness and wheezing.   Cardiovascular: Negative for chest pain, palpitations and leg swelling.  Endocrine: Negative for cold intolerance and heat intolerance.  Skin: Negative for color change.  Neurological: Negative for dizziness, weakness, light-headedness and headaches.      Objective:    BP 120/80 (BP Location: Left Arm, Patient Position: Sitting, Cuff Size: Normal)   Pulse 81   Temp 98.1 F (36.7 C) (Oral)   Resp 16   Ht 5\' 6"  (1.676 m)   Wt 168 lb (76.2 kg)   SpO2 97%   BMI 27.12 kg/m  Nursing note and vital signs reviewed.  Physical Exam  Constitutional: She is oriented to person, place, and time. She appears well-developed and well-nourished. No distress.  Cardiovascular: Normal rate, regular rhythm, normal heart sounds and intact distal pulses.   Pulmonary/Chest: Effort normal and  breath sounds normal.  Neurological: She is alert and oriented to person, place, and time.  Skin: Skin is warm and dry.  Psychiatric: She has a normal mood and affect. Her behavior is normal. Judgment and thought content normal.       Assessment & Plan:   Problem List Items Addressed This Visit      Endocrine   Hypothyroidism    Hypothyroidism is well controlled with the current regimen and most recent TSH is stable. Continue current dosage of Armour thyroid and continue to monitor.       Relevant Medications   ARMOUR THYROID 60 MG tablet     Other   ADD (attention deficit disorder)    ADD appears well  controlled with the current medication regimen and no adverse side effects. Eating and sleeping well with no cardiac symptoms. [place] Controlled Substance Database reviewed with no irregularities. Continue current dosage of Concerta. [place] and will be finding new primary care upon arrival.       Relevant Medications   methylphenidate (CONCERTA) 36 MG PO CR tablet   Generalized anxiety disorder - Primary    Generalized anxiety disorder appears well controlled with current regimen and no adverse side effects. Indicates that her stress levels continue to remain elevated as [place] [facility] Continue current dosage of escitalopram. Continue to monitor.       Relevant Medications   escitalopram (LEXAPRO) 10 MG tablet       I am having Ms. Clavin maintain her ARMOUR THYROID, escitalopram, and methylphenidate.   Meds ordered this encounter  Medications  . ARMOUR THYROID 60 MG tablet    Sig: Take 1 tablets along with the 15 mg by mouth daily    Dispense:  90 tablet    Refill:  1    Order Specific Question:   Supervising Provider    Answer:   Hillard DankerRAWFORD, ELIZABETH A [[id number]]  . escitalopram (LEXAPRO) 10 MG tablet    Sig: Take 1 tablet (10 mg total) by mouth daily.    Dispense:  90 tablet    Refill:  1    Order Specific Question:   Supervising Provider    Answer:   Hillard DankerRAWFORD, ELIZABETH A [[id number]]  . DISCONTD: methylphenidate (CONCERTA) 36 MG PO CR tablet    Sig: Take 1 tablet (36 mg total) by mouth daily.    Dispense:  30 tablet    Refill:  0    Must be generic and from [facility]. Fill on or after 06/03/16    Order Specific Question:   Supervising Provider    Answer:   Hillard DankerRAWFORD, ELIZABETH A [[id number]]  . DISCONTD: methylphenidate (CONCERTA) 36 MG PO CR tablet    Sig: Take 1 tablet (36 mg total) by mouth daily.    Dispense:  30 tablet    Refill:  0    Must be generic and from [facility]. Fill on or after 07/04/16    Order Specific  Question:   Supervising Provider    Answer:   Hillard DankerRAWFORD, ELIZABETH A [[id number]]  . methylphenidate (CONCERTA) 36 MG PO CR tablet    Sig: Take 1 tablet (36 mg total) by mouth daily.    Dispense:  30 tablet    Refill:  0    Must be generic and from [employer]. Fill on or after 08/01/16    Order Specific Question:   Supervising Provider  Answer:   Pricilla Holm A [[id number]]     Follow-up: Return if symptoms worsen or fail to improve.  Mauricio Po, FNP

## 2016-04-29 NOTE — Assessment & Plan Note (Signed)
Hypothyroidism is well controlled with the current regimen and most recent TSH is stable. Continue current dosage of Armour thyroid and continue to monitor.

## 2016-06-03 ENCOUNTER — Ambulatory Visit: Payer: BLUE CROSS/BLUE SHIELD | Admitting: Family

## 2017-03-10 IMAGING — CR DG HIP (WITH OR WITHOUT PELVIS) 2-3V*L*
3 series · 3 of 3 positions shown · non-contrast
Comparison: None.

CLINICAL DATA: Left hip pain

EXAM:
DG HIP (WITH OR WITHOUT PELVIS) 2-3V LEFT

[w pelvis (1 of 2)]
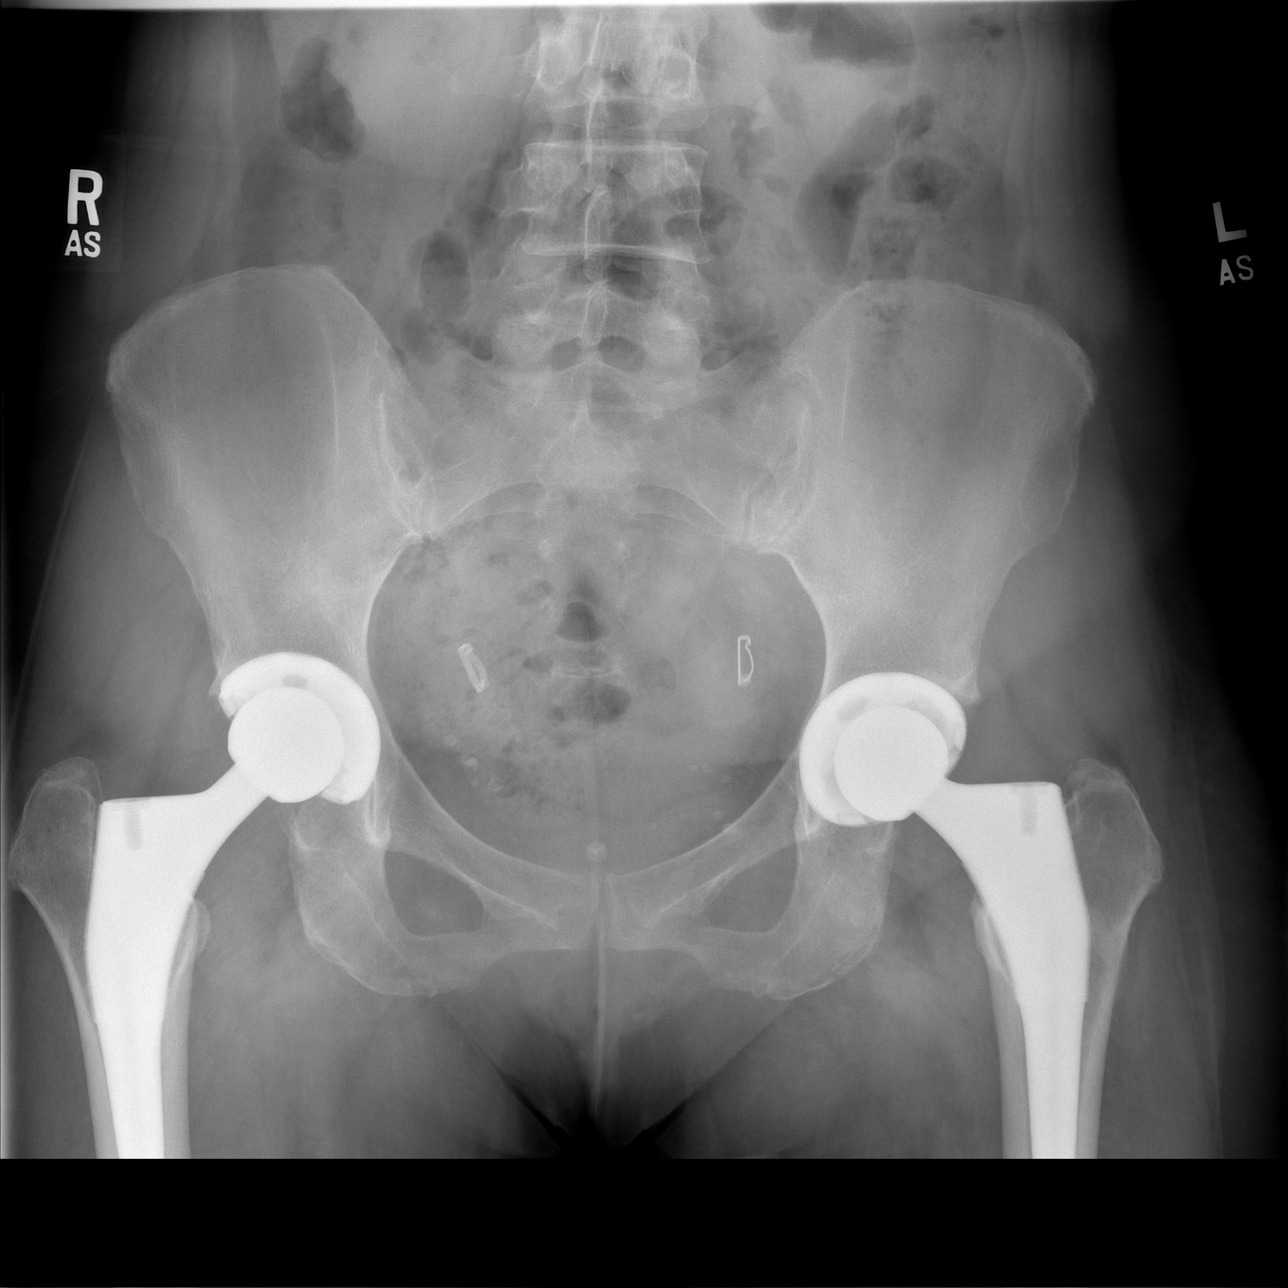

[w pelvis (2 of 2)]
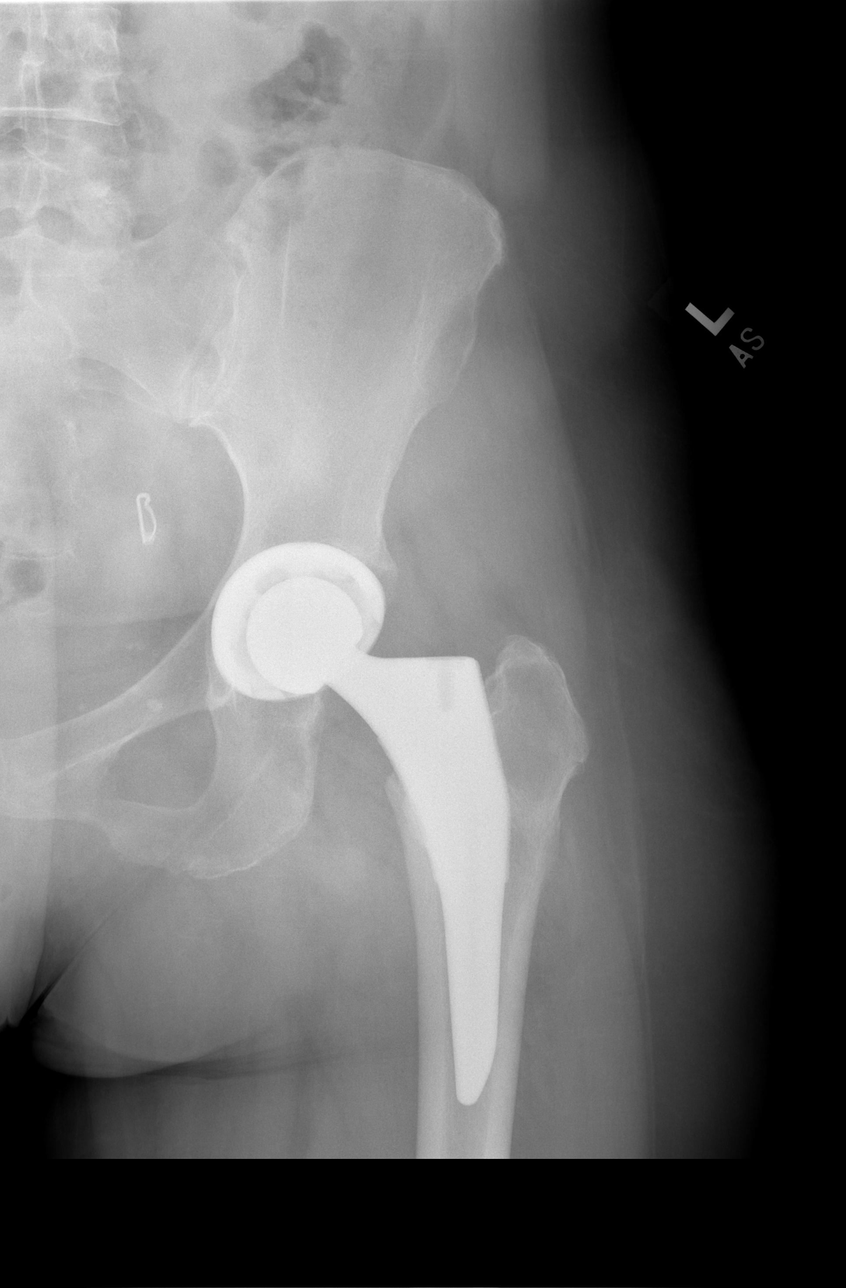

[t hip frog leg left *]
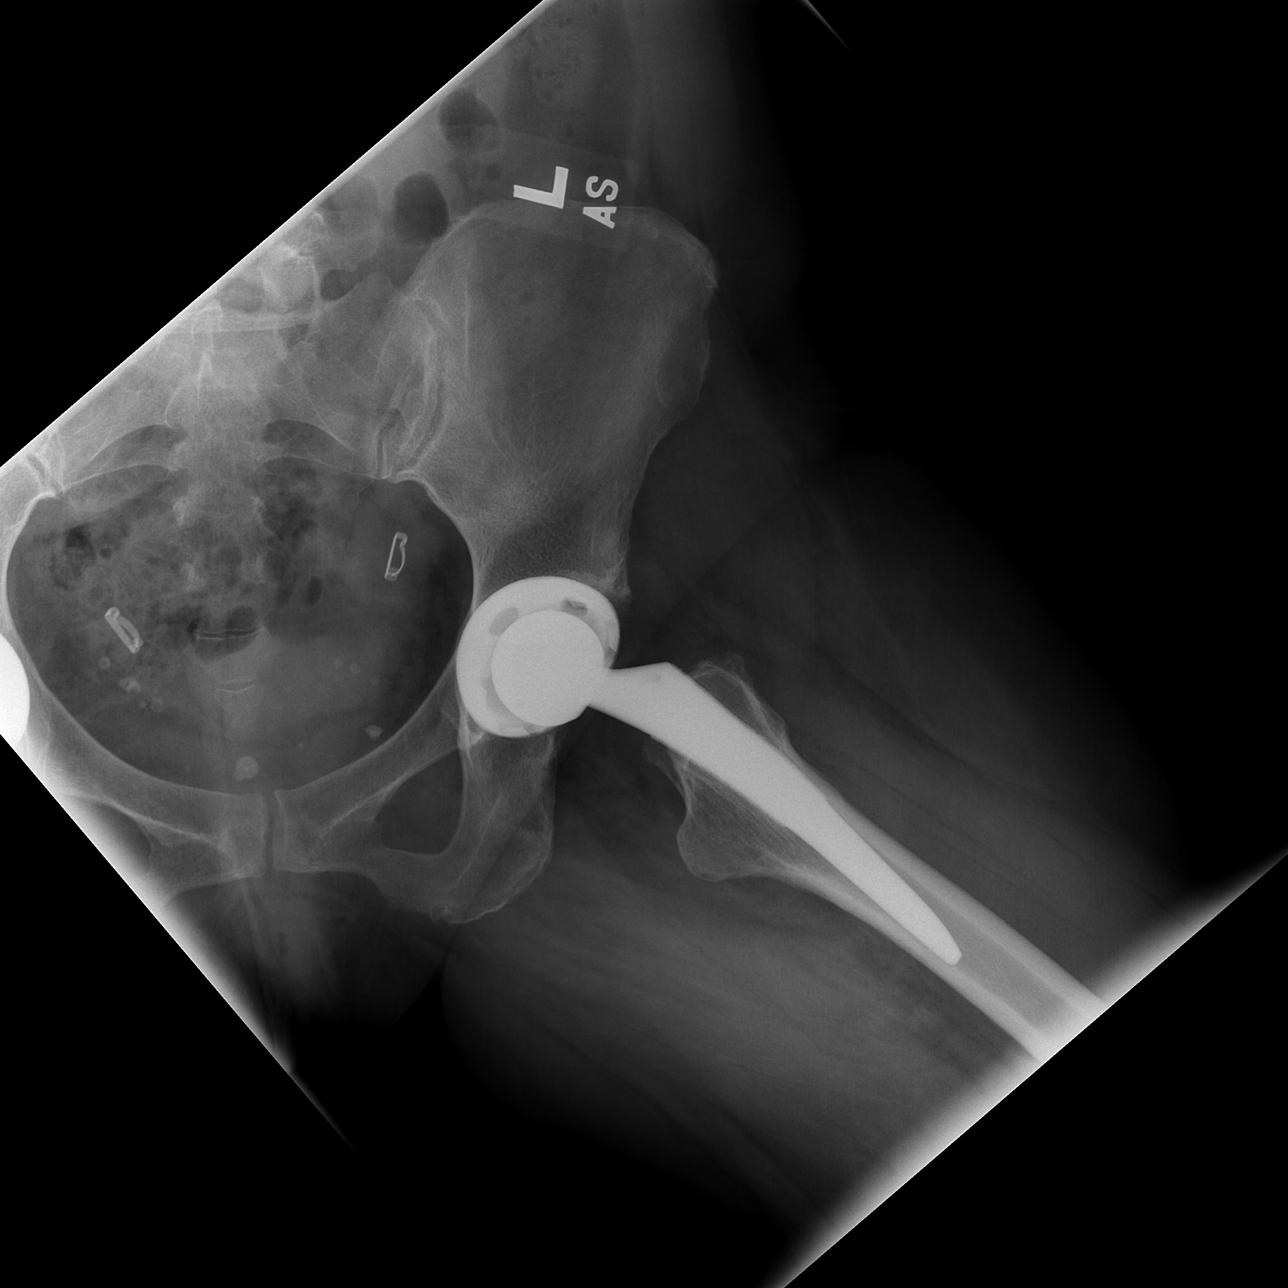

[3 of 3 positions shown; findings below may reference images not displayed]

FINDINGS: Bilateral total hip arthroplasties are noted. Tubal ligation clips
are in place. No displaced pelvic fracture. Left hip arthroplasty is
appropriately located. No fracture line visualized. No evidence for
hardware failure. Pelvic phleboliths are noted.
IMPRESSION: No acute finding.
